# Patient Record
Sex: Female | Born: 1945 | Race: Black or African American | Hispanic: No | Marital: Married | Smoking: Never smoker
Health system: Southern US, Community
[De-identification: ages and names within clinical notes are randomized; demographics above are authoritative.]

## PROBLEM LIST (undated history)

## (undated) DIAGNOSIS — M1990 Unspecified osteoarthritis, unspecified site: Secondary | ICD-10-CM

## (undated) DIAGNOSIS — G510 Bell's palsy: Secondary | ICD-10-CM

## (undated) DIAGNOSIS — E119 Type 2 diabetes mellitus without complications: Secondary | ICD-10-CM

## (undated) DIAGNOSIS — I10 Essential (primary) hypertension: Secondary | ICD-10-CM

## (undated) DIAGNOSIS — Z860101 Personal history of adenomatous and serrated colon polyps: Secondary | ICD-10-CM

## (undated) HISTORY — PX: BUNIONECTOMY: SHX129

## (undated) HISTORY — PX: FINGER SURGERY: SHX640

## (undated) HISTORY — PX: BREAST BIOPSY: SHX20

---

## 2005-04-14 ENCOUNTER — Ambulatory Visit: Payer: Self-pay | Admitting: Internal Medicine

## 2006-07-20 ENCOUNTER — Ambulatory Visit: Payer: Self-pay | Admitting: Internal Medicine

## 2006-08-04 ENCOUNTER — Ambulatory Visit: Payer: Self-pay | Admitting: Internal Medicine

## 2007-04-02 ENCOUNTER — Ambulatory Visit: Payer: Self-pay | Admitting: Internal Medicine

## 2008-10-07 ENCOUNTER — Ambulatory Visit: Payer: Self-pay | Admitting: Internal Medicine

## 2009-01-19 ENCOUNTER — Ambulatory Visit: Payer: Self-pay | Admitting: Gastroenterology

## 2009-12-22 ENCOUNTER — Ambulatory Visit: Payer: Self-pay | Admitting: Internal Medicine

## 2011-05-04 ENCOUNTER — Ambulatory Visit: Payer: Self-pay | Admitting: Internal Medicine

## 2013-01-02 ENCOUNTER — Ambulatory Visit: Payer: Self-pay | Admitting: Internal Medicine

## 2013-04-30 ENCOUNTER — Ambulatory Visit: Payer: Self-pay | Admitting: Internal Medicine

## 2014-01-14 ENCOUNTER — Ambulatory Visit: Payer: Self-pay | Admitting: Internal Medicine

## 2014-03-14 DIAGNOSIS — Z860101 Personal history of adenomatous and serrated colon polyps: Secondary | ICD-10-CM | POA: Insufficient documentation

## 2014-03-25 ENCOUNTER — Ambulatory Visit: Payer: Self-pay | Admitting: Gastroenterology

## 2014-03-28 LAB — PATHOLOGY REPORT

## 2015-01-27 ENCOUNTER — Ambulatory Visit
Admit: 2015-01-27 | Disposition: A | Discharge status: Home / Self Care | Payer: Self-pay | Attending: Internal Medicine | Admitting: Internal Medicine

## 2016-07-12 ENCOUNTER — Encounter: Payer: Self-pay | Admitting: Emergency Medicine

## 2016-07-12 ENCOUNTER — Emergency Department
Admission: EM | Admit: 2016-07-12 | Discharge: 2016-07-12 | Disposition: A | Discharge status: Home / Self Care | Payer: Medicare Other | Attending: Emergency Medicine | Admitting: Emergency Medicine

## 2016-07-12 DIAGNOSIS — T7840XA Allergy, unspecified, initial encounter: Secondary | ICD-10-CM

## 2016-07-12 DIAGNOSIS — L500 Allergic urticaria: Secondary | ICD-10-CM | POA: Insufficient documentation

## 2016-07-12 DIAGNOSIS — L509 Urticaria, unspecified: Secondary | ICD-10-CM

## 2016-07-12 MED ORDER — PREDNISONE 20 MG PO TABS
60.0000 mg | ORAL_TABLET | Freq: Once | ORAL | Status: AC
  Administered 2016-07-12: 60 mg via ORAL
  Filled 2016-07-12: qty 3

## 2016-07-12 MED ORDER — DIPHENHYDRAMINE HCL 25 MG PO CAPS
25.0000 mg | ORAL_CAPSULE | Freq: Once | ORAL | Status: AC
  Administered 2016-07-12: 25 mg via ORAL
  Filled 2016-07-12: qty 1

## 2016-07-12 MED ORDER — FAMOTIDINE 20 MG PO TABS
20.0000 mg | ORAL_TABLET | Freq: Once | ORAL | Status: AC
  Administered 2016-07-12: 20 mg via ORAL
  Filled 2016-07-12: qty 1

## 2016-07-12 MED ORDER — FAMOTIDINE 20 MG PO TABS: 20.0000 mg | ORAL_TABLET | Freq: Two times a day (BID) | ORAL | 0 refills | Status: DC

## 2016-07-12 MED ORDER — PREDNISONE 20 MG PO TABS: ORAL_TABLET | ORAL | 0 refills | Status: DC

## 2016-07-12 MED ORDER — EPINEPHRINE 0.3 MG/0.3ML IJ SOAJ: 0.3000 mg | Freq: Once | INTRAMUSCULAR | 0 refills | Status: AC

## 2016-07-12 NOTE — Discharge Instructions (Signed)
1. Take the following medicines for the next 4 days: Prednisone 60mg daily Pepcid 20mg twice daily 2. Take Benadryl as needed for itching. 3. Use Epi-Pen in case of acute, life-threatening allergic reaction. 4. Return to the ER for worsening symptoms, persistent vomiting, difficulty breathing or other concerns.  

## 2016-07-12 NOTE — ED Triage Notes (Signed)
Patient ambulatory to triage with steady gait, without difficulty or distress noted; pt st ate shrimp 830pm, 87min PTA began breaking out in itchy rash; st applied cortisone cream with decreased symptoms now noted; denies hx of same; denies any accomp symptoms

## 2016-07-12 NOTE — ED Notes (Signed)
Pt presents to ED with c/o allergic reaction. Pt states had shrimp last night and began to have itching and whelps all over body. Pt reports did not change laundry detergent, body soap, or lotions, denies taking benadryl. Pt states used cortisone cream with relief of whelps but is still having itching. No whelps noted on body. Respirations even and unlabored, skin warm and dry.

## 2016-07-12 NOTE — ED Provider Notes (Signed)
[facility] Emergency Department Provider Note   ____________________________________________   First MD Initiated Contact with Patient 07/12/16 [phone]     (approximate)  I have reviewed the triage vital signs and the nursing notes.   HISTORY  Chief Complaint Allergic Reaction    HPI Cassandra Parks is a 70 y.o. female who presents to the ED from home with a chief complaint of allergic reaction and hives. Patient reports she ate shrimp approximately 8:30 PM. 30 minutes later she began to break out in itchy hives all over her body. Apply cortisone cream with near resolution of symptoms. Presents to the ED for evaluation. Denies associated facial or tongue swelling, throat tightness, shortness of breath, chest pain, abdominal pain, nausea, vomiting, diarrhea. States she has eaten the same dish previously without reaction. Denies other new exposures such as medicines, antibiotics, over the counters. Denies recent tick bite. Nothing made her symptoms worse. Cortisone cream improved her symptoms.   Past medical history None  There are no active problems to display for this patient.   No past surgical history on file.  Prior to Admission medications   Medication Sig Start Date End Date Taking? Authorizing Provider  EPINEPHrine 0.3 mg/0.3 mL IJ SOAJ injection Inject 0.3 mLs (0.3 mg total) into the muscle once. 07/12/16 07/12/16  Paulette Blanch, MD  famotidine (PEPCID) 20 MG tablet Take 1 tablet (20 mg total) by mouth 2 (two) times daily. 07/12/16   Paulette Blanch, MD  predniSONE (DELTASONE) 20 MG tablet 3 tablets daily x 4 days 07/12/16   Paulette Blanch, MD    Allergies Review of patient's allergies indicates no known allergies.  No family history on file.  Social History Social History  Substance Use Topics  . Smoking status: Never Smoker  . Smokeless tobacco: Never Used  . Alcohol use No    Review of Systems  Constitutional: No fever/chills. Eyes: No  visual changes. ENT: No sore throat. Cardiovascular: Denies chest pain. Respiratory: Denies shortness of breath. Gastrointestinal: No abdominal pain.  No nausea, no vomiting.  No diarrhea.  No constipation. Genitourinary: Negative for dysuria. Musculoskeletal: Negative for back pain. Skin: Positive for rash. Neurological: Negative for headaches, focal weakness or numbness.  10-point ROS otherwise negative.  ____________________________________________   PHYSICAL EXAM:  VITAL SIGNS: ED Triage Vitals  Enc Vitals Group     BP 07/12/16 0027 (!) 142/83     Pulse Rate 07/12/16 0027 79     Resp 07/12/16 0243 18     Temp 07/12/16 0027 98 F (36.7 C)     Temp Source 07/12/16 0027 Oral     SpO2 07/12/16 0027 98 %     Weight 07/12/16 0025 130 lb (59 kg)     Height 07/12/16 0025 5\' 1"  (1.549 m)     Head Circumference --      Peak Flow --      Pain Score --      Pain Loc --      Pain Edu? --      Excl. in [place]? --     Constitutional: Alert and oriented. Well appearing and in no acute distress. Eyes: Conjunctivae are normal. PERRL. EOMI. Head: Atraumatic. Nose: No congestion/rhinnorhea. Mouth/Throat: Mucous membranes are moist.  Oropharynx non-erythematous.  There is no hoarse or muffled voice. There is no drooling. Neck: No stridor.  Soft submental space. Cardiovascular: Normal rate, regular rhythm. Grossly normal heart sounds.  Good peripheral circulation. Respiratory: Normal respiratory effort.  No retractions. Lungs CTAB. No wheezing. Gastrointestinal: Soft and nontender. No distention. No abdominal bruits. No CVA tenderness. Musculoskeletal: No lower extremity tenderness nor edema.  No joint effusions. Neurologic:  Normal speech and language. No gross focal neurologic deficits are appreciated. No gait instability. Skin:  Skin is warm, dry and intact. Urticaria nearly completely resolved. There are 1-2 wheals on right upper arm.Marland Kitchen Psychiatric: Mood and affect are normal. Speech  and behavior are normal.  ____________________________________________   LABS (all labs ordered are listed, but only abnormal results are displayed)  Labs Reviewed - No data to display ____________________________________________  EKG  None ____________________________________________  RADIOLOGY  None ____________________________________________   PROCEDURES  Procedure(s) performed: None  Procedures  Critical Care performed: No  ____________________________________________   INITIAL IMPRESSION / ASSESSMENT AND PLAN / ED COURSE  Pertinent labs & imaging results that were available during my care of the patient were reviewed by me and considered in my medical decision making (see chart for details).  70 year old female who presents with urticaria shortly after eating shrimp. Will treat with Benadryl, prednisone, Pepcid and refer her for allergy testing. Strict return precautions given. Patient and family verbalized understanding and agree with plan of care.  Clinical Course     ____________________________________________   FINAL CLINICAL IMPRESSION(S) / ED DIAGNOSES  Final diagnoses:  Allergic reaction, initial encounter  Urticaria      NEW MEDICATIONS STARTED DURING THIS VISIT:  New Prescriptions   EPINEPHRINE 0.3 MG/0.3 ML IJ SOAJ INJECTION    Inject 0.3 mLs (0.3 mg total) into the muscle once.   FAMOTIDINE (PEPCID) 20 MG TABLET    Take 1 tablet (20 mg total) by mouth 2 (two) times daily.   PREDNISONE (DELTASONE) 20 MG TABLET    3 tablets daily x 4 days     Note:  This document was prepared using Dragon voice recognition software and may include unintentional dictation errors.    Paulette Blanch, MD 07/12/16 [phone]

## 2016-11-30 ENCOUNTER — Other Ambulatory Visit: Payer: Self-pay | Admitting: Internal Medicine

## 2016-11-30 DIAGNOSIS — Z1231 Encounter for screening mammogram for malignant neoplasm of breast: Secondary | ICD-10-CM

## 2017-01-30 ENCOUNTER — Encounter: Payer: Self-pay | Admitting: Radiology

## 2017-01-30 ENCOUNTER — Ambulatory Visit
Admission: RE | Admit: 2017-01-30 | Discharge: 2017-01-30 | Disposition: A | Discharge status: Home / Self Care | Payer: Medicare Other | Source: Ambulatory Visit | Attending: Internal Medicine | Admitting: Internal Medicine

## 2017-01-30 DIAGNOSIS — Z1231 Encounter for screening mammogram for malignant neoplasm of breast: Secondary | ICD-10-CM | POA: Diagnosis not present

## 2018-07-17 ENCOUNTER — Other Ambulatory Visit: Payer: Self-pay | Admitting: Internal Medicine

## 2018-07-17 DIAGNOSIS — Z1231 Encounter for screening mammogram for malignant neoplasm of breast: Secondary | ICD-10-CM

## 2018-08-09 ENCOUNTER — Ambulatory Visit
Admission: RE | Admit: 2018-08-09 | Discharge: 2018-08-09 | Disposition: A | Discharge status: Home / Self Care | Payer: Medicare Other | Source: Ambulatory Visit | Attending: Internal Medicine | Admitting: Internal Medicine

## 2018-08-09 DIAGNOSIS — Z1231 Encounter for screening mammogram for malignant neoplasm of breast: Secondary | ICD-10-CM | POA: Insufficient documentation

## 2019-04-19 ENCOUNTER — Other Ambulatory Visit (HOSPITAL_COMMUNITY): Payer: Self-pay | Admitting: Internal Medicine

## 2019-04-19 ENCOUNTER — Other Ambulatory Visit: Payer: Self-pay | Admitting: Internal Medicine

## 2019-04-19 DIAGNOSIS — R0989 Other specified symptoms and signs involving the circulatory and respiratory systems: Secondary | ICD-10-CM

## 2019-04-25 ENCOUNTER — Ambulatory Visit
Admission: RE | Admit: 2019-04-25 | Discharge: 2019-04-25 | Disposition: A | Discharge status: Home / Self Care | Payer: Medicare Other | Source: Ambulatory Visit | Attending: Internal Medicine | Admitting: Internal Medicine

## 2019-04-25 ENCOUNTER — Encounter (INDEPENDENT_AMBULATORY_CARE_PROVIDER_SITE_OTHER): Payer: Self-pay

## 2019-04-25 ENCOUNTER — Other Ambulatory Visit: Payer: Self-pay

## 2019-04-25 DIAGNOSIS — R0989 Other specified symptoms and signs involving the circulatory and respiratory systems: Secondary | ICD-10-CM | POA: Diagnosis present

## 2019-07-31 ENCOUNTER — Other Ambulatory Visit: Payer: Self-pay | Admitting: Internal Medicine

## 2019-07-31 DIAGNOSIS — Z1231 Encounter for screening mammogram for malignant neoplasm of breast: Secondary | ICD-10-CM

## 2019-08-22 ENCOUNTER — Inpatient Hospital Stay: Admission: RE | Admit: 2019-08-22 | Payer: Medicare Other | Source: Ambulatory Visit

## 2019-12-25 ENCOUNTER — Ambulatory Visit
Admission: RE | Admit: 2019-12-25 | Discharge: 2019-12-25 | Disposition: A | Discharge status: Home / Self Care | Payer: Medicare Other | Source: Ambulatory Visit | Attending: Internal Medicine | Admitting: Internal Medicine

## 2019-12-25 ENCOUNTER — Other Ambulatory Visit: Payer: Self-pay

## 2019-12-25 DIAGNOSIS — Z1231 Encounter for screening mammogram for malignant neoplasm of breast: Secondary | ICD-10-CM | POA: Diagnosis present

## 2019-12-30 ENCOUNTER — Other Ambulatory Visit: Payer: Self-pay | Admitting: Internal Medicine

## 2019-12-30 DIAGNOSIS — R928 Other abnormal and inconclusive findings on diagnostic imaging of breast: Secondary | ICD-10-CM

## 2019-12-30 DIAGNOSIS — N6310 Unspecified lump in the right breast, unspecified quadrant: Secondary | ICD-10-CM

## 2020-01-10 ENCOUNTER — Ambulatory Visit
Admission: RE | Admit: 2020-01-10 | Discharge: 2020-01-10 | Disposition: A | Discharge status: Home / Self Care | Payer: Medicare Other | Source: Ambulatory Visit | Attending: Internal Medicine | Admitting: Internal Medicine

## 2020-01-10 DIAGNOSIS — R928 Other abnormal and inconclusive findings on diagnostic imaging of breast: Secondary | ICD-10-CM | POA: Insufficient documentation

## 2020-01-10 DIAGNOSIS — N6310 Unspecified lump in the right breast, unspecified quadrant: Secondary | ICD-10-CM | POA: Diagnosis present

## 2020-02-05 ENCOUNTER — Other Ambulatory Visit: Payer: Self-pay | Admitting: Internal Medicine

## 2020-02-05 DIAGNOSIS — R7989 Other specified abnormal findings of blood chemistry: Secondary | ICD-10-CM

## 2020-02-11 ENCOUNTER — Ambulatory Visit
Admission: RE | Admit: 2020-02-11 | Discharge: 2020-02-11 | Disposition: A | Discharge status: Home / Self Care | Payer: Medicare Other | Source: Ambulatory Visit | Attending: Internal Medicine | Admitting: Internal Medicine

## 2020-02-11 ENCOUNTER — Other Ambulatory Visit: Payer: Self-pay

## 2020-02-11 ENCOUNTER — Encounter (INDEPENDENT_AMBULATORY_CARE_PROVIDER_SITE_OTHER): Payer: Self-pay

## 2020-02-11 DIAGNOSIS — R7989 Other specified abnormal findings of blood chemistry: Secondary | ICD-10-CM | POA: Diagnosis not present

## 2020-02-11 DIAGNOSIS — N184 Chronic kidney disease, stage 4 (severe): Secondary | ICD-10-CM | POA: Insufficient documentation

## 2020-02-11 DIAGNOSIS — E1122 Type 2 diabetes mellitus with diabetic chronic kidney disease: Secondary | ICD-10-CM | POA: Insufficient documentation

## 2020-02-14 ENCOUNTER — Ambulatory Visit: Payer: Medicare Other

## 2020-05-19 ENCOUNTER — Other Ambulatory Visit: Payer: Self-pay | Admitting: Gastroenterology

## 2020-05-19 DIAGNOSIS — R1903 Right lower quadrant abdominal swelling, mass and lump: Secondary | ICD-10-CM

## 2020-05-19 DIAGNOSIS — R634 Abnormal weight loss: Secondary | ICD-10-CM

## 2020-05-27 ENCOUNTER — Ambulatory Visit: Admission: RE | Admit: 2020-05-27 | Payer: Medicare Other | Source: Ambulatory Visit

## 2020-05-28 ENCOUNTER — Other Ambulatory Visit: Payer: Self-pay

## 2020-05-28 ENCOUNTER — Other Ambulatory Visit
Admission: RE | Admit: 2020-05-28 | Discharge: 2020-05-28 | Disposition: A | Discharge status: Home / Self Care | Payer: Medicare Other | Source: Ambulatory Visit | Attending: Gastroenterology | Admitting: Gastroenterology

## 2020-05-28 DIAGNOSIS — Z20822 Contact with and (suspected) exposure to covid-19: Secondary | ICD-10-CM | POA: Diagnosis not present

## 2020-05-28 DIAGNOSIS — Z01812 Encounter for preprocedural laboratory examination: Secondary | ICD-10-CM | POA: Diagnosis present

## 2020-05-28 LAB — SARS CORONAVIRUS 2 (TAT 6-24 HRS): SARS Coronavirus 2: NEGATIVE

## 2020-05-29 ENCOUNTER — Encounter: Payer: Self-pay | Admitting: *Deleted

## 2020-06-01 ENCOUNTER — Ambulatory Visit: Payer: Medicare Other | Admitting: Anesthesiology

## 2020-06-01 ENCOUNTER — Other Ambulatory Visit: Payer: Self-pay

## 2020-06-01 ENCOUNTER — Encounter: Payer: Self-pay | Admitting: *Deleted

## 2020-06-01 ENCOUNTER — Ambulatory Visit
Admission: RE | Admit: 2020-06-01 | Discharge: 2020-06-01 | Disposition: A | Discharge status: Home / Self Care | Payer: Medicare Other | Attending: Gastroenterology | Admitting: Gastroenterology

## 2020-06-01 ENCOUNTER — Encounter
Admission: RE | Disposition: A | Discharge status: Home / Self Care | Payer: Self-pay | Source: Home / Self Care | Attending: Gastroenterology

## 2020-06-01 DIAGNOSIS — Z800 Family history of malignant neoplasm of digestive organs: Secondary | ICD-10-CM | POA: Diagnosis not present

## 2020-06-01 DIAGNOSIS — K640 First degree hemorrhoids: Secondary | ICD-10-CM | POA: Diagnosis not present

## 2020-06-01 DIAGNOSIS — G709 Myoneural disorder, unspecified: Secondary | ICD-10-CM | POA: Insufficient documentation

## 2020-06-01 DIAGNOSIS — E119 Type 2 diabetes mellitus without complications: Secondary | ICD-10-CM | POA: Insufficient documentation

## 2020-06-01 DIAGNOSIS — Z79899 Other long term (current) drug therapy: Secondary | ICD-10-CM | POA: Diagnosis not present

## 2020-06-01 DIAGNOSIS — R634 Abnormal weight loss: Secondary | ICD-10-CM | POA: Diagnosis not present

## 2020-06-01 DIAGNOSIS — G510 Bell's palsy: Secondary | ICD-10-CM | POA: Insufficient documentation

## 2020-06-01 DIAGNOSIS — Z6821 Body mass index (BMI) 21.0-21.9, adult: Secondary | ICD-10-CM | POA: Insufficient documentation

## 2020-06-01 DIAGNOSIS — D124 Benign neoplasm of descending colon: Secondary | ICD-10-CM | POA: Diagnosis not present

## 2020-06-01 DIAGNOSIS — Z7952 Long term (current) use of systemic steroids: Secondary | ICD-10-CM | POA: Diagnosis not present

## 2020-06-01 DIAGNOSIS — I10 Essential (primary) hypertension: Secondary | ICD-10-CM | POA: Insufficient documentation

## 2020-06-01 DIAGNOSIS — K3189 Other diseases of stomach and duodenum: Secondary | ICD-10-CM | POA: Insufficient documentation

## 2020-06-01 DIAGNOSIS — D123 Benign neoplasm of transverse colon: Secondary | ICD-10-CM | POA: Diagnosis not present

## 2020-06-01 DIAGNOSIS — Z1211 Encounter for screening for malignant neoplasm of colon: Secondary | ICD-10-CM | POA: Diagnosis present

## 2020-06-01 HISTORY — DX: Bell's palsy: G51.0

## 2020-06-01 HISTORY — DX: Essential (primary) hypertension: I10

## 2020-06-01 HISTORY — DX: Type 2 diabetes mellitus without complications: E11.9

## 2020-06-01 HISTORY — PX: COLONOSCOPY WITH PROPOFOL: SHX5780

## 2020-06-01 HISTORY — PX: ESOPHAGOGASTRODUODENOSCOPY (EGD) WITH PROPOFOL: SHX5813

## 2020-06-01 LAB — GLUCOSE, CAPILLARY: Glucose-Capillary: 116 mg/dL — ABNORMAL HIGH (ref 70–99)

## 2020-06-01 SURGERY — COLONOSCOPY WITH PROPOFOL
Anesthesia: General

## 2020-06-01 MED ORDER — SODIUM CHLORIDE 0.9 % IV SOLN: INTRAVENOUS | Status: DC

## 2020-06-01 MED ORDER — PROPOFOL 500 MG/50ML IV EMUL
INTRAVENOUS | Status: DC | PRN
  Administered 2020-06-01: 150 ug/kg/min via INTRAVENOUS

## 2020-06-01 MED ORDER — PROPOFOL 10 MG/ML IV BOLUS
INTRAVENOUS | Status: AC
  Filled 2020-06-01: qty 20

## 2020-06-01 MED ORDER — LIDOCAINE HCL (PF) 2 % IJ SOLN
INTRAMUSCULAR | Status: AC
  Filled 2020-06-01: qty 5

## 2020-06-01 MED ORDER — PROPOFOL 10 MG/ML IV BOLUS
INTRAVENOUS | Status: DC | PRN
  Administered 2020-06-01: 100 mg via INTRAVENOUS

## 2020-06-01 MED ORDER — LIDOCAINE HCL (CARDIAC) PF 100 MG/5ML IV SOSY
PREFILLED_SYRINGE | INTRAVENOUS | Status: DC | PRN
  Administered 2020-06-01: 100 mg via INTRAVENOUS

## 2020-06-01 MED ORDER — PROPOFOL 500 MG/50ML IV EMUL
INTRAVENOUS | Status: AC
  Filled 2020-06-01: qty 50

## 2020-06-01 NOTE — Transfer of Care (Signed)
Immediate Anesthesia Transfer of Care Note  Patient: Cassandra Parks  Procedure(s) Performed: COLONOSCOPY WITH PROPOFOL (N/A ) ESOPHAGOGASTRODUODENOSCOPY (EGD) WITH PROPOFOL (N/A )  Patient Location: PACU and Endoscopy Unit  Anesthesia Type:General  Level of Consciousness: awake and drowsy  Airway & Oxygen Therapy: Patient Spontanous Breathing  Post-op Assessment: Report given to RN and Post -op Vital signs reviewed and stable  Post vital signs: Reviewed and stable  Last Vitals:  Vitals Value Taken Time  BP 102/50 06/01/20 1131  Temp 36.2 C 06/01/20 1131  Pulse 67 06/01/20 1132  Resp 14 06/01/20 1132  SpO2 100 % 06/01/20 1132    Last Pain:  Vitals:   06/01/20 1131  TempSrc: Tympanic  PainSc: Asleep         Complications: No complications documented.

## 2020-06-01 NOTE — Op Note (Signed)
[facility] Gastroenterology Patient Name: Cassandra Parks Procedure Date: 06/01/2020 10:50 AM MRN: [id number] Account #: [id number] Date of Birth: [birth year] Admit Type: Outpatient Age: 74 Room: [facility] ENDO ROOM 2 Gender: Female Note Status: Finalized Procedure:             Upper GI endoscopy Indications:           Weight loss Providers:             Andrey Farmer MD, MD Referring MD:          Leona Carry. Hall Busing, MD (Referring MD) Medicines:             Monitored Anesthesia Care Complications:         No immediate complications. Estimated blood loss:                         Minimal. Procedure:             Pre-Anesthesia Assessment:                        - Prior to the procedure, a History and Physical was                         performed, and patient medications and allergies were                         reviewed. The patient is competent. The risks and                         benefits of the procedure and the sedation options and                         risks were discussed with the patient. All questions                         were answered and informed consent was obtained.                         Patient identification and proposed procedure were                         verified by the physician, the nurse, the anesthetist                         and the technician in the endoscopy suite. Mental                         Status Examination: alert and oriented. Airway                         Examination: normal oropharyngeal airway and neck                         mobility. Respiratory Examination: clear to                         auscultation. CV Examination: normal. Prophylactic  Antibiotics: The patient does not require prophylactic                         antibiotics. Prior Anticoagulants: The patient has                         taken no previous anticoagulant or antiplatelet                         agents. ASA Grade Assessment: II - A  patient with mild                         systemic disease. After reviewing the risks and                         benefits, the patient was deemed in satisfactory                         condition to undergo the procedure. The anesthesia                         plan was to use monitored anesthesia care (MAC).                         Immediately prior to administration of medications,                         the patient was re-assessed for adequacy to receive                         sedatives. The heart rate, respiratory rate, oxygen                         saturations, blood pressure, adequacy of pulmonary                         ventilation, and response to care were monitored                         throughout the procedure. The physical status of the                         patient was re-assessed after the procedure.                        After obtaining informed consent, the endoscope was                         passed under direct vision. Throughout the procedure,                         the patient's blood pressure, pulse, and oxygen                         saturations were monitored continuously. The Endoscope                         was introduced through the mouth, and advanced to the  second part of duodenum. The upper GI endoscopy was                         accomplished without difficulty. The patient tolerated                         the procedure well. Findings:      The examined esophagus was normal.      A single localized 2 mm erosion with no stigmata of recent bleeding was       found in the gastric antrum. Biopsies were taken with a cold forceps for       histology. Estimated blood loss was minimal.      The exam of the stomach was otherwise normal.      The examined duodenum was normal. Impression:            - Normal esophagus.                        - Erosive gastropathy with no stigmata of recent                         bleeding.  Biopsied.                        - Normal examined duodenum. Recommendation:        - Discharge patient to home.                        - Resume previous diet.                        - Continue present medications.                        - Await pathology results.                        - Return to referring physician as previously                         scheduled. Procedure Code(s):     --- Professional ---                        [phone], Esophagogastroduodenoscopy, flexible,                         transoral; with biopsy, single or multiple Diagnosis Code(s):     --- Professional ---                        K31.89, Other diseases of stomach and duodenum                        R63.4, Abnormal weight loss CPT copyright 2019 American Medical Association. All rights reserved. The codes documented in this report are preliminary and upon coder review may  be revised to meet current compliance requirements. Andrey Farmer, MD Andrey Farmer MD, MD 06/01/2020 11:30:10 AM Number of Addenda: 0 Note Initiated On: 06/01/2020 10:50 AM Estimated Blood Loss:  Estimated blood loss was minimal.      [facility]

## 2020-06-01 NOTE — H&P (Signed)
Outpatient short stay form Pre-procedure 06/01/2020 10:20 AM Raylene Miyamoto MD, MPH  Primary Physician: Dr. Hall Busing  Reason for visit:  Weight Loss  History of present illness:   74 y/o lady with family history of colon cancer in her daughter who recently passed away along with her mother who recently passed away. No blood thinners.    Current Facility-Administered Medications:  .  0.9 %  sodium chloride infusion, , Intravenous, Continuous, Dehaven Sine, Hilton Cork, MD  Medications Prior to Admission  Medication Sig Dispense Refill Last Dose  . losartan (COZAAR) 25 MG tablet Take 25 mg by mouth daily.   05/31/2020 at Unknown time  . Na Sulfate-K Sulfate-Mg Sulf 17.5-3.13-1.6 GM/177ML SOLN Take 1 Bottle by mouth.     . triamterene-hydrochlorothiazide (MAXZIDE-25) 37.5-25 MG tablet Take 1 tablet by mouth daily.   05/31/2020 at Unknown time  . famotidine (PEPCID) 20 MG tablet Take 1 tablet (20 mg total) by mouth 2 (two) times daily. (Patient not taking: Reported on 06/01/2020) 8 tablet 0 Not Taking at Unknown time  . predniSONE (DELTASONE) 20 MG tablet 3 tablets daily x 4 days (Patient not taking: Reported on 06/01/2020) 12 tablet 0 Not Taking at Unknown time     No Known Allergies   Past Medical History:  Diagnosis Date  . Diabetes mellitus without complication ([place])   . Hypertension   . Palsy, Bell's     Review of systems:  Otherwise negative.    Physical Exam  Gen: Alert, oriented. Appears stated age.  HEENT: [place]/AT. PERRLA. Lungs: No respiratory distress Abd: soft, benign, no masses.  Ext: No edema.    Planned procedures: Proceed with EGD/colonoscopy. The patient understands the nature of the planned procedure, indications, risks, alternatives and potential complications including but not limited to bleeding, infection, perforation, damage to internal organs and possible oversedation/side effects from anesthesia. The patient agrees and gives consent to proceed.  Please refer to  procedure notes for findings, recommendations and patient disposition/instructions.     Raylene Miyamoto MD, MPH. Gastroenterology 06/01/2020  10:20 AM

## 2020-06-01 NOTE — Anesthesia Postprocedure Evaluation (Signed)
Anesthesia Post Note  Patient: SKARLET LYONS  Procedure(s) Performed: COLONOSCOPY WITH PROPOFOL (N/A ) ESOPHAGOGASTRODUODENOSCOPY (EGD) WITH PROPOFOL (N/A )  Patient location during evaluation: Endoscopy Anesthesia Type: General Level of consciousness: awake and alert Pain management: pain level controlled Vital Signs Assessment: post-procedure vital signs reviewed and stable Respiratory status: spontaneous breathing, nonlabored ventilation, respiratory function stable and patient connected to nasal cannula oxygen Cardiovascular status: blood pressure returned to baseline and stable Postop Assessment: no apparent nausea or vomiting Anesthetic complications: no   No complications documented.   Last Vitals:  Vitals:   06/01/20 1151 06/01/20 1201  BP: 115/63 125/73  Pulse: (!) 59 66  Resp: 12 16  Temp:    SpO2: 98% 100%    Last Pain:  Vitals:   06/01/20 1201  TempSrc:   PainSc: 0-No pain                 Precious Haws Demosthenes Virnig

## 2020-06-01 NOTE — Op Note (Addendum)
[facility] Gastroenterology Patient Name: Montina Dorrance Procedure Date: 06/01/2020 10:49 AM MRN: [id number] Account #: [id number] Date of Birth: 1946/06/23 Admit Type: Outpatient Age: 74 Room: [facility] ENDO ROOM 2 Gender: Female Note Status: Supervisor Override Procedure:             Colonoscopy Indications:           Family history of colon cancer in a first-degree                         relative before age 32 years, Personal history of                         colonic polyps, Weight loss Providers:             Andrey Farmer MD, MD Referring MD:          Leona Carry. Hall Busing, MD (Referring MD) Medicines:             Monitored Anesthesia Care Complications:         No immediate complications. Estimated blood loss:                         Minimal. Procedure:             Pre-Anesthesia Assessment:                        - Prior to the procedure, a History and Physical was                         performed, and patient medications and allergies were                         reviewed. The patient is competent. The risks and                         benefits of the procedure and the sedation options and                         risks were discussed with the patient. All questions                         were answered and informed consent was obtained.                         Patient identification and proposed procedure were                         verified by the physician, the nurse, the anesthetist                         and the technician in the endoscopy suite. Mental                         Status Examination: alert and oriented. Airway                         Examination: normal oropharyngeal airway and neck  mobility. Respiratory Examination: clear to                         auscultation. CV Examination: normal. Prophylactic                         Antibiotics: The patient does not require prophylactic                         antibiotics. Prior  Anticoagulants: The patient has                         taken no previous anticoagulant or antiplatelet                         agents. ASA Grade Assessment: II - A patient with mild                         systemic disease. After reviewing the risks and                         benefits, the patient was deemed in satisfactory                         condition to undergo the procedure. The anesthesia                         plan was to use monitored anesthesia care (MAC).                         Immediately prior to administration of medications,                         the patient was re-assessed for adequacy to receive                         sedatives. The heart rate, respiratory rate, oxygen                         saturations, blood pressure, adequacy of pulmonary                         ventilation, and response to care were monitored                         throughout the procedure. The physical status of the                         patient was re-assessed after the procedure.                        After obtaining informed consent, the colonoscope was                         passed under direct vision. Throughout the procedure,                         the patient's blood pressure, pulse, and oxygen  saturations were monitored continuously. The                         Colonoscope was introduced through the anus and                         advanced to the the terminal ileum. The colonoscopy                         was performed without difficulty. The patient                         tolerated the procedure well. The quality of the bowel                         preparation was good. Findings:      The perianal and digital rectal examinations were normal.      The terminal ileum appeared normal.      A 6 mm polyp was found in the transverse colon. The polyp was sessile.       The polyp was removed with a cold snare. Resection and retrieval were       complete.  Estimated blood loss was minimal.      A 5 mm polyp was found in the descending colon. The polyp was sessile.       The polyp was removed with a cold snare. Resection and retrieval were       complete. Estimated blood loss was minimal.      A 2 mm polyp was found in the descending colon. The polyp was sessile.       The polyp was removed with a jumbo cold forceps. Resection and retrieval       were complete. Estimated blood loss was minimal.      Non-bleeding internal hemorrhoids were found during retroflexion. The       hemorrhoids were Grade I (internal hemorrhoids that do not prolapse).      The exam was otherwise without abnormality on direct and retroflexion       views. Impression:            - The examined portion of the ileum was normal.                        - One 6 mm polyp in the transverse colon, removed with                         a cold snare. Resected and retrieved.                        - One 5 mm polyp in the descending colon, removed with                         a cold snare. Resected and retrieved.                        - One 2 mm polyp in the descending colon, removed with                         a jumbo cold forceps. Resected and retrieved.                        -  Non-bleeding internal hemorrhoids.                        - The examination was otherwise normal on direct and                         retroflexion views. Recommendation:        - Discharge patient to home.                        - Resume previous diet.                        - Continue present medications.                        - Await pathology results.                        - Repeat colonoscopy in 3 years for surveillance.                        - Return to referring physician as previously                         scheduled. Procedure Code(s):     --- Professional ---                        [phone], Colonoscopy, flexible; with removal of                         tumor(s), polyp(s), or other  lesion(s) by snare                         technique                        [id number], 59, Colonoscopy, flexible; with biopsy, single                         or multiple Diagnosis Code(s):     --- Professional ---                        K64.0, First degree hemorrhoids                        K63.5, Polyp of colon                        Z80.0, Family history of malignant neoplasm of                         digestive organs                        R63.4, Abnormal weight loss CPT copyright 2019 American Medical Association. All rights reserved. The codes documented in this report are preliminary and upon coder review may  be revised to meet current compliance requirements. Andrey Farmer, MD Andrey Farmer MD, MD 06/01/2020 11:34:00 AM Number of Addenda: 0 Note Initiated On: 06/01/2020 10:49 AM Scope Withdrawal Time: 0 hours 12 minutes 59 seconds  Total Procedure  Duration: 0 hours 22 minutes 5 seconds  Estimated Blood Loss:  Estimated blood loss was minimal.      [facility]

## 2020-06-01 NOTE — Interval H&P Note (Signed)
History and Physical Interval Note:  06/01/2020 10:24 AM  Cassandra Parks  has presented today for surgery, with the diagnosis of HX ADEN POLYP WEIGHT LOSS.  The various methods of treatment have been discussed with the patient and family. After consideration of risks, benefits and other options for treatment, the patient has consented to  Procedure(s): COLONOSCOPY WITH PROPOFOL (N/A) ESOPHAGOGASTRODUODENOSCOPY (EGD) WITH PROPOFOL (N/A) as a surgical intervention.  The patient's history has been reviewed, patient examined, no change in status, stable for surgery.  I have reviewed the patient's chart and labs.  Questions were answered to the patient's satisfaction.     Lesly Rubenstein  Ok to proceed with EGD/Colonoscopy

## 2020-06-01 NOTE — Anesthesia Preprocedure Evaluation (Signed)
Anesthesia Evaluation  Patient identified by MRN, date of birth, ID band Patient awake    Reviewed: Allergy & Precautions, H&P , NPO status , Patient's Chart, lab work & pertinent test results  History of Anesthesia Complications Negative for: history of anesthetic complications  Airway Mallampati: III  TM Distance: >3 FB Neck ROM: limited    Dental  (+) Chipped   Pulmonary neg pulmonary ROS, neg shortness of breath,    Pulmonary exam normal        Cardiovascular Exercise Tolerance: Good hypertension, (-) angina(-) Past MI and (-) DOE Normal cardiovascular exam     Neuro/Psych  Neuromuscular disease negative psych ROS   GI/Hepatic negative GI ROS, Neg liver ROS, neg GERD  ,  Endo/Other  diabetes, Type 2  Renal/GU negative Renal ROS  negative genitourinary   Musculoskeletal   Abdominal   Peds  Hematology negative hematology ROS (+)   Anesthesia Other Findings Past Medical History: No date: Diabetes mellitus without complication (HCC) No date: Hypertension No date: Palsy, Bell's  Past Surgical History: No date: BREAST BIOPSY; Left     Comment:  negative No date: varicose veins  BMI    Body Mass Index: 21.73 kg/m      Reproductive/Obstetrics negative OB ROS                             Anesthesia Physical Anesthesia Plan  ASA: II  Anesthesia Plan: General   Post-op Pain Management:    Induction: Intravenous  PONV Risk Score and Plan: Propofol infusion and TIVA  Airway Management Planned: Natural Airway and Nasal Cannula  Additional Equipment:   Intra-op Plan:   Post-operative Plan:   Informed Consent: I have reviewed the patients History and Physical, chart, labs and discussed the procedure including the risks, benefits and alternatives for the proposed anesthesia with the patient or authorized representative who has indicated his/her understanding and acceptance.      Dental Advisory Given  Plan Discussed with: Anesthesiologist, CRNA and Surgeon  Anesthesia Plan Comments: (Patient consented for risks of anesthesia including but not limited to:  - adverse reactions to medications - risk of intubation if required - damage to eyes, teeth, lips or other oral mucosa - nerve damage due to positioning  - sore throat or hoarseness - Damage to heart, brain, nerves, lungs, other parts of body or loss of life  Patient voiced understanding.)        Anesthesia Quick Evaluation

## 2020-06-02 ENCOUNTER — Encounter: Payer: Self-pay | Admitting: Gastroenterology

## 2020-06-02 LAB — SURGICAL PATHOLOGY

## 2020-06-05 ENCOUNTER — Ambulatory Visit
Admission: RE | Admit: 2020-06-05 | Discharge: 2020-06-05 | Disposition: A | Discharge status: Home / Self Care | Payer: Medicare Other | Source: Ambulatory Visit | Attending: Gastroenterology | Admitting: Gastroenterology

## 2020-06-05 ENCOUNTER — Other Ambulatory Visit: Payer: Self-pay

## 2020-06-05 DIAGNOSIS — R1903 Right lower quadrant abdominal swelling, mass and lump: Secondary | ICD-10-CM

## 2020-06-05 DIAGNOSIS — R634 Abnormal weight loss: Secondary | ICD-10-CM | POA: Diagnosis present

## 2020-06-05 MED ORDER — IOHEXOL 300 MG/ML  SOLN
75.0000 mL | Freq: Once | INTRAMUSCULAR | Status: AC | PRN
  Administered 2020-06-05: 75 mL via INTRAVENOUS

## 2020-07-09 ENCOUNTER — Other Ambulatory Visit: Payer: Self-pay | Admitting: General Surgery

## 2020-07-09 NOTE — Progress Notes (Signed)
Subjective:     Patient ID: Cassandra Parks is a 74 y.o. female.  HPI  The following portions of the patient's history were reviewed and updated as appropriate.  This a new patient is here today for: office visit. Here for evaluate inguinal hernia referred by Dr Haig Prophet. She states she has had a right ingiuinal knot for about 2-3 months with some discomfort. She states the knot will come in and out at random times. Bowels move daily.   The patient has experienced significant weight loss this year.  She buried a daughter from colon cancer in spring and then her 81 year old mother this past July.  During this time she reports a significant loss in appetite.  Still managing her own home.  Review of Systems  Constitutional: Negative for chills and fever.  Respiratory: Negative for cough.         Chief Complaint  Patient presents with  . New Patient    evaluate inguinal hernia     BP 125/73   Pulse 83   Temp 36.2 C (97.2 F)   Ht 154.9 cm (5\' 1" )   Wt 51.3 kg (113 lb)   SpO2 99%   BMI 21.35 kg/m       Past Medical History:  Diagnosis Date  . Bell's palsy   . Colon polyp 03/25/14   hyperplastic, tubular adenoma  . Diabetes mellitus type 2, uncomplicated (CMS-HCC)   . Hypertension           Past Surgical History:  Procedure Laterality Date  . COLONOSCOPY  03/25/14   repeat 5 years per [facility]  . COLONOSCOPY  06/01/2020   (4) Tubular adenoma/Repeat 51yrs/[facility]  . EGD  06/01/2020   Normal EGD biopsy/No Repeat/[facility]  . varicose vein removal                OB History    Gravida  3   Para  3   Term      Preterm      AB      Living        SAB      TAB      Ectopic      Molar      Multiple      Live Births          Obstetric Comments  Age at first period 25 Age of first pregnancy 89         Social History          Socioeconomic History  . Marital status: Married    Spouse name: Not on file  .  Number of children: Not on file  . Years of education: Not on file  . Highest education level: Not on file  Occupational History  . Not on file  Tobacco Use  . Smoking status: Never Smoker  . Smokeless tobacco: Never Used  Substance and Sexual Activity  . Alcohol use: No    Alcohol/week: 0.0 standard drinks  . Drug use: No  . Sexual activity: Defer  Other Topics Concern  . Not on file  Social History Narrative  . Not on file   Social Determinants of Health      Financial Resource Strain:   . Difficulty of Paying Living Expenses:   Food Insecurity:   . Worried About Charity fundraiser in the Last Year:   . Arboriculturist in the Last Year:   Transportation Needs:   . Film/video editor (Medical):   Marland Kitchen  Lack of Transportation (Non-Medical):        No Known Allergies  Current Medications        Current Outpatient Medications  Medication Sig Dispense Refill  . losartan (COZAAR) 25 MG tablet Take 25 mg by mouth once daily.  5  . triamterene-hydrochlorothiazide (MAXZIDE-25) 37.5-25 mg tablet Take 1 tablet by mouth once daily        No current facility-administered medications for this visit.           Family History  Problem Relation Age of Onset  . Colon cancer Father        ? age  . Diabetes Mother   . High blood pressure (Hypertension) Mother   . Breast cancer Paternal Aunt   . Colon cancer Daughter 101       Objective:   Physical Exam Exam conducted with a chaperone present.  Constitutional:      Appearance: Normal appearance.  Cardiovascular:     Rate and Rhythm: Normal rate and regular rhythm.     Pulses: Normal pulses.          Femoral pulses are 2+ on the right side and 2+ on the left side.    Heart sounds: Normal heart sounds.  Pulmonary:     Effort: Pulmonary effort is normal.     Breath sounds: Normal breath sounds.  Abdominal:     Palpations: Abdomen is soft.     Hernia: A hernia is present. Hernia is present in the  right inguinal area.    Musculoskeletal:     Cervical back: Neck supple.  Lymphadenopathy:     Lower Body: No right inguinal adenopathy. No left inguinal adenopathy.  Skin:    General: Skin is warm and dry.  Neurological:     Mental Status: She is alert and oriented to person, place, and time.  Psychiatric:        Mood and Affect: Mood normal.        Behavior: Behavior normal.    Labs and Radiology:   CT of the abdomen pelvis dated June 05, 2020 was independently reviewed:  IMPRESSION: 1. There are small, right greater than left fat containing inguinal hernias. 2. Aortic Atherosclerosis (ICD10-I70.0).  On my review, the left side is unimpressive.  Clinically asymptomatic.     Assessment:     Symptomatic right inguinal hernia.  No clinical evidence of left inguinal hernia.    Plan:     Options for management reviewed: 1) observation versus 2) elective repair.  Considering the symptoms the patient's experience and the need to manually reduce the hernia, with discomfort during reduction, operative repair seems prudent.  Pros and cons of prosthetic mesh reinforcement were discussed.  I think she would be well served based on her active lifestyle in good health.  Surgery is tentatively scheduled for July 24, 2020.     Entered by Karie Fetch, RN, acting as a scribe for Dr. Hervey Ard, MD.  The documentation recorded by the scribe accurately reflects the service I personally performed and the decisions made by me.   Robert Bellow, MD FACS

## 2020-07-16 ENCOUNTER — Other Ambulatory Visit: Payer: Self-pay

## 2020-07-16 ENCOUNTER — Encounter
Admission: RE | Admit: 2020-07-16 | Discharge: 2020-07-16 | Disposition: A | Discharge status: Home / Self Care | Payer: Medicare Other | Source: Ambulatory Visit | Attending: General Surgery | Admitting: General Surgery

## 2020-07-16 DIAGNOSIS — Z01818 Encounter for other preprocedural examination: Secondary | ICD-10-CM | POA: Insufficient documentation

## 2020-07-16 DIAGNOSIS — I10 Essential (primary) hypertension: Secondary | ICD-10-CM | POA: Insufficient documentation

## 2020-07-16 DIAGNOSIS — E118 Type 2 diabetes mellitus with unspecified complications: Secondary | ICD-10-CM | POA: Insufficient documentation

## 2020-07-16 HISTORY — DX: Unspecified osteoarthritis, unspecified site: M19.90

## 2020-07-16 NOTE — Patient Instructions (Signed)
Your procedure is scheduled on:07-24-20 FRIDAY Report to Day Surgery on the 2nd floor of the [facility]. To find out your arrival time, please call [phone] between 1PM - 3PM on:07-23-20 THURSDAY  REMEMBER: Instructions that are not followed completely may result in serious medical risk, up to and including death; or upon the discretion of your surgeon and anesthesiologist your surgery may need to be rescheduled.  Do not eat food after midnight the night before surgery.  No gum chewing, lozengers or hard candies.  You may however, drink WATER up to 2 hours before you are scheduled to arrive for your surgery. Do not drink anything within 2 hours of your scheduled arrival time.  Type 1 and Type 2 diabetics should only drink water.  TAKE THESE MEDICATIONS THE MORNING OF SURGERY WITH A SIP OF WATER: -NONE  One week prior to surgery: Stop Anti-inflammatories (NSAIDS) such as Advil, Aleve, Ibuprofen, Motrin, Naproxen, Naprosyn and Aspirin based products such as Excedrin, Goodys Powder, BC Powder-OK TO TAKE TYLENOL IF NEEDED  Stop ANY OVER THE COUNTER supplements until after surgery  No Alcohol for 24 hours before or after surgery.  No Smoking including e-cigarettes for 24 hours prior to surgery.  No chewable tobacco products for at least 6 hours prior to surgery.  No nicotine patches on the day of surgery.  Do not use any "recreational" drugs for at least a week prior to your surgery.  Please be advised that the combination of cocaine and anesthesia may have negative outcomes, up to and including death. If you test positive for cocaine, your surgery will be cancelled.  On the morning of surgery brush your teeth with toothpaste and water, you may rinse your mouth with mouthwash if you wish. Do not swallow any toothpaste or mouthwash.  Do not wear jewelry, make-up, hairpins, clips or nail polish.  Do not wear lotions, powders, or perfumes.   Do not shave 48 hours prior to  surgery.   Contact lenses, hearing aids and dentures may not be worn into surgery.  Do not bring valuables to the hospital. [facility] is not responsible for any missing/lost belongings or valuables.   Use CHG Soap  as directed on instruction sheet.  Notify your doctor if there is any change in your medical condition (cold, fever, infection).  Wear comfortable clothing (specific to your surgery type) to the hospital.  Plan for stool softeners for home use; pain medications have a tendency to cause constipation. You can also help prevent constipation by eating foods high in fiber such as fruits and vegetables and drinking plenty of fluids as your diet allows.  After surgery, you can help prevent lung complications by doing breathing exercises.  Take deep breaths and cough every 1-2 hours. Your doctor may order a device called an Incentive Spirometer to help you take deep breaths. When coughing or sneezing, hold a pillow firmly against your incision with both hands. This is called "splinting." Doing this helps protect your incision. It also decreases belly discomfort.  If you are being admitted to the hospital overnight, leave your suitcase in the car. After surgery it may be brought to your room.  If you are being discharged the day of surgery, you will not be allowed to drive home. You will need a responsible adult (18 years or older) to drive you home and stay with you that night.   If you are taking public transportation, you will need to have a responsible adult (18 years or  older) with you. Please confirm with your physician that it is acceptable to use public transportation.   Please call the [facility] at [phone] if you have any questions about these instructions.  Visitation Policy:  Patients undergoing a surgery or procedure may have one family member or support person with them as long as that person is not COVID-19 positive or experiencing its  symptoms.  That person may remain in the waiting area during the procedure.  Inpatient Visitation Update:   In an effort to ensure the safety of our team members and our patients, we are implementing a change to our visitation policy:  Effective Monday, Aug. 9, at 7 a.m., inpatients will be allowed one support person.  o The support person may change daily.  o The support person must pass our screening, gel in and out, and wear a mask at all times, including in the patient's room.  o Patients must also wear a mask when staff or their support person are in the room.  o Masking is required regardless of vaccination status.  Systemwide, no visitors 17 or younger.

## 2020-07-21 ENCOUNTER — Inpatient Hospital Stay: Admission: RE | Admit: 2020-07-21 | Payer: Medicare Other | Source: Ambulatory Visit

## 2020-07-22 ENCOUNTER — Other Ambulatory Visit: Payer: Self-pay

## 2020-07-22 ENCOUNTER — Other Ambulatory Visit
Admission: RE | Admit: 2020-07-22 | Discharge: 2020-07-22 | Disposition: A | Discharge status: Home / Self Care | Payer: Medicare Other | Source: Ambulatory Visit | Attending: General Surgery | Admitting: General Surgery

## 2020-07-22 ENCOUNTER — Encounter
Admission: RE | Admit: 2020-07-22 | Discharge: 2020-07-22 | Disposition: A | Discharge status: Home / Self Care | Payer: Medicare Other | Source: Ambulatory Visit

## 2020-07-22 DIAGNOSIS — Z01812 Encounter for preprocedural laboratory examination: Secondary | ICD-10-CM | POA: Insufficient documentation

## 2020-07-22 DIAGNOSIS — Z20822 Contact with and (suspected) exposure to covid-19: Secondary | ICD-10-CM | POA: Diagnosis not present

## 2020-07-22 DIAGNOSIS — E119 Type 2 diabetes mellitus without complications: Secondary | ICD-10-CM | POA: Insufficient documentation

## 2020-07-22 DIAGNOSIS — I10 Essential (primary) hypertension: Secondary | ICD-10-CM | POA: Insufficient documentation

## 2020-07-22 LAB — POTASSIUM: Potassium: 3.9 mmol/L (ref 3.5–5.1)

## 2020-07-23 LAB — SARS CORONAVIRUS 2 (TAT 6-24 HRS): SARS Coronavirus 2: NEGATIVE

## 2020-07-23 MED ORDER — SODIUM CHLORIDE 0.9 % IV SOLN: INTRAVENOUS | Status: DC

## 2020-07-23 MED ORDER — CHLORHEXIDINE GLUCONATE 0.12 % MT SOLN: 15.0000 mL | Freq: Once | OROMUCOSAL | Status: AC

## 2020-07-23 MED ORDER — CEFAZOLIN SODIUM-DEXTROSE 2-4 GM/100ML-% IV SOLN
2.0000 g | INTRAVENOUS | Status: AC
  Administered 2020-07-24: 2 g via INTRAVENOUS

## 2020-07-23 MED ORDER — ORAL CARE MOUTH RINSE: 15.0000 mL | Freq: Once | OROMUCOSAL | Status: AC

## 2020-07-24 ENCOUNTER — Ambulatory Visit: Payer: Medicare Other | Admitting: Urgent Care

## 2020-07-24 ENCOUNTER — Ambulatory Visit
Admission: RE | Admit: 2020-07-24 | Discharge: 2020-07-24 | Disposition: A | Discharge status: Home / Self Care | Payer: Medicare Other | Attending: General Surgery | Admitting: General Surgery

## 2020-07-24 ENCOUNTER — Encounter
Admission: RE | Disposition: A | Discharge status: Home / Self Care | Payer: Self-pay | Source: Home / Self Care | Attending: General Surgery

## 2020-07-24 ENCOUNTER — Ambulatory Visit: Payer: Medicare Other | Admitting: Certified Registered"

## 2020-07-24 DIAGNOSIS — K4090 Unilateral inguinal hernia, without obstruction or gangrene, not specified as recurrent: Secondary | ICD-10-CM | POA: Insufficient documentation

## 2020-07-24 HISTORY — PX: INGUINAL HERNIA REPAIR: SHX194

## 2020-07-24 LAB — GLUCOSE, CAPILLARY
Glucose-Capillary: 126 mg/dL — ABNORMAL HIGH (ref 70–99)
Glucose-Capillary: 142 mg/dL — ABNORMAL HIGH (ref 70–99)

## 2020-07-24 SURGERY — REPAIR, HERNIA, INGUINAL, ADULT
Anesthesia: General | Laterality: Right

## 2020-07-24 MED ORDER — ONDANSETRON HCL 4 MG/2ML IJ SOLN
INTRAMUSCULAR | Status: AC
  Filled 2020-07-24: qty 2

## 2020-07-24 MED ORDER — KETOROLAC TROMETHAMINE 30 MG/ML IJ SOLN
INTRAMUSCULAR | Status: DC | PRN
  Administered 2020-07-24: 15 mg via INTRAMUSCULAR

## 2020-07-24 MED ORDER — LIDOCAINE HCL 1 % IJ SOLN
INTRAMUSCULAR | Status: DC | PRN
  Administered 2020-07-24: 10 mL

## 2020-07-24 MED ORDER — DEXAMETHASONE SODIUM PHOSPHATE 10 MG/ML IJ SOLN
INTRAMUSCULAR | Status: AC
  Filled 2020-07-24: qty 1

## 2020-07-24 MED ORDER — LIDOCAINE HCL (PF) 2 % IJ SOLN
INTRAMUSCULAR | Status: AC
  Filled 2020-07-24: qty 5

## 2020-07-24 MED ORDER — PROPOFOL 10 MG/ML IV BOLUS
INTRAVENOUS | Status: AC
  Filled 2020-07-24: qty 20

## 2020-07-24 MED ORDER — BUPIVACAINE-EPINEPHRINE (PF) 0.5% -1:200000 IJ SOLN
INTRAMUSCULAR | Status: DC | PRN
  Administered 2020-07-24: 10 mL via PERINEURAL

## 2020-07-24 MED ORDER — ACETAMINOPHEN 10 MG/ML IV SOLN
INTRAVENOUS | Status: AC
  Filled 2020-07-24: qty 100

## 2020-07-24 MED ORDER — PROPOFOL 10 MG/ML IV BOLUS
INTRAVENOUS | Status: DC | PRN
  Administered 2020-07-24: 80 mg via INTRAVENOUS

## 2020-07-24 MED ORDER — FENTANYL CITRATE (PF) 100 MCG/2ML IJ SOLN: 25.0000 ug | INTRAMUSCULAR | Status: DC | PRN

## 2020-07-24 MED ORDER — CHLORHEXIDINE GLUCONATE 0.12 % MT SOLN
OROMUCOSAL | Status: AC
  Administered 2020-07-24: 15 mL via OROMUCOSAL
  Filled 2020-07-24: qty 15

## 2020-07-24 MED ORDER — DEXAMETHASONE SODIUM PHOSPHATE 10 MG/ML IJ SOLN
INTRAMUSCULAR | Status: DC | PRN
  Administered 2020-07-24: 10 mg via INTRAVENOUS

## 2020-07-24 MED ORDER — FAMOTIDINE 20 MG PO TABS
ORAL_TABLET | ORAL | Status: AC
  Filled 2020-07-24: qty 1

## 2020-07-24 MED ORDER — FENTANYL CITRATE (PF) 250 MCG/5ML IJ SOLN
INTRAMUSCULAR | Status: AC
  Filled 2020-07-24: qty 5

## 2020-07-24 MED ORDER — CEFAZOLIN SODIUM-DEXTROSE 2-4 GM/100ML-% IV SOLN
INTRAVENOUS | Status: AC
  Filled 2020-07-24: qty 100

## 2020-07-24 MED ORDER — LIDOCAINE HCL (CARDIAC) PF 100 MG/5ML IV SOSY
PREFILLED_SYRINGE | INTRAVENOUS | Status: DC | PRN
  Administered 2020-07-24: 50 mg via INTRAVENOUS

## 2020-07-24 MED ORDER — FENTANYL CITRATE (PF) 100 MCG/2ML IJ SOLN
INTRAMUSCULAR | Status: DC | PRN
  Administered 2020-07-24: 50 ug via INTRAVENOUS

## 2020-07-24 MED ORDER — EPHEDRINE SULFATE 50 MG/ML IJ SOLN
INTRAMUSCULAR | Status: DC | PRN
  Administered 2020-07-24: 5 mg via INTRAVENOUS
  Administered 2020-07-24: 20 mg via INTRAVENOUS

## 2020-07-24 MED ORDER — ACETAMINOPHEN 10 MG/ML IV SOLN
INTRAVENOUS | Status: DC | PRN
  Administered 2020-07-24: 1000 mg via INTRAVENOUS

## 2020-07-24 MED ORDER — PHENYLEPHRINE HCL (PRESSORS) 10 MG/ML IV SOLN
INTRAVENOUS | Status: DC | PRN
  Administered 2020-07-24: 100 ug via INTRAVENOUS
  Administered 2020-07-24 (×2): 200 ug via INTRAVENOUS

## 2020-07-24 MED ORDER — KETOROLAC TROMETHAMINE 30 MG/ML IJ SOLN
INTRAMUSCULAR | Status: DC | PRN
  Administered 2020-07-24: 15 mg via INTRAVENOUS

## 2020-07-24 MED ORDER — HYDROCODONE-ACETAMINOPHEN 5-325 MG PO TABS: ORAL_TABLET | ORAL | 0 refills | Status: DC

## 2020-07-24 MED ORDER — ONDANSETRON HCL 4 MG/2ML IJ SOLN: 4.0000 mg | Freq: Once | INTRAMUSCULAR | Status: DC | PRN

## 2020-07-24 SURGICAL SUPPLY — 37 items
APL PRP STRL LF DISP 70% ISPRP (MISCELLANEOUS) ×1
BLADE SURG 15 STRL SS SAFETY (BLADE) ×6 IMPLANT
CANISTER SUCT 1200ML W/VALVE (MISCELLANEOUS) IMPLANT
CHLORAPREP W/TINT 26 (MISCELLANEOUS) ×3 IMPLANT
CLOSURE WOUND 1/2 X4 (GAUZE/BANDAGES/DRESSINGS) ×1
COVER WAND RF STERILE (DRAPES) ×3 IMPLANT
DECANTER SPIKE VIAL GLASS SM (MISCELLANEOUS) IMPLANT
DRAIN PENROSE 1/4X12 LTX STRL (WOUND CARE) ×3 IMPLANT
DRAPE LAPAROTOMY 100X77 ABD (DRAPES) ×3 IMPLANT
DRSG TEGADERM 4X4.75 (GAUZE/BANDAGES/DRESSINGS) ×3 IMPLANT
DRSG TELFA 4X3 1S NADH ST (GAUZE/BANDAGES/DRESSINGS) ×3 IMPLANT
ELECT CAUTERY BLADE 6.4 (BLADE) ×3 IMPLANT
ELECT REM PT RETURN 9FT ADLT (ELECTROSURGICAL) ×3
ELECTRODE REM PT RTRN 9FT ADLT (ELECTROSURGICAL) ×1 IMPLANT
GLOVE BIO SURGEON STRL SZ7.5 (GLOVE) ×3 IMPLANT
GLOVE INDICATOR 8.0 STRL GRN (GLOVE) ×3 IMPLANT
GOWN STRL REUS W/ TWL LRG LVL3 (GOWN DISPOSABLE) ×2 IMPLANT
GOWN STRL REUS W/TWL LRG LVL3 (GOWN DISPOSABLE) ×6
KIT TURNOVER KIT A (KITS) ×3 IMPLANT
LABEL OR SOLS (LABEL) IMPLANT
MESH HERNIA 6X12 ULTRAPRO MED (Mesh General) ×1 IMPLANT
MESH HERNIA ULTRAPRO MED (Mesh General) ×2 IMPLANT
NEEDLE HYPO 22GX1.5 SAFETY (NEEDLE) ×6 IMPLANT
PACK BASIN MINOR (MISCELLANEOUS) ×3 IMPLANT
STRIP CLOSURE SKIN 1/2X4 (GAUZE/BANDAGES/DRESSINGS) ×2 IMPLANT
SUT PDS AB 0 CT1 27 (SUTURE) ×3 IMPLANT
SUT SURGILON 0 BLK (SUTURE) ×6 IMPLANT
SUT VIC AB 2-0 SH 27 (SUTURE) ×3
SUT VIC AB 2-0 SH 27XBRD (SUTURE) ×1 IMPLANT
SUT VIC AB 3-0 54X BRD REEL (SUTURE) ×1 IMPLANT
SUT VIC AB 3-0 BRD 54 (SUTURE) ×3
SUT VIC AB 3-0 SH 27 (SUTURE) ×3
SUT VIC AB 3-0 SH 27X BRD (SUTURE) ×1 IMPLANT
SUT VIC AB 4-0 FS2 27 (SUTURE) ×3 IMPLANT
SWABSTK COMLB BENZOIN TINCTURE (MISCELLANEOUS) ×3 IMPLANT
SYR 10ML LL (SYRINGE) ×3 IMPLANT
SYR 3ML LL SCALE MARK (SYRINGE) ×3 IMPLANT

## 2020-07-24 NOTE — OR Nursing (Signed)
Discharge pending patient void.

## 2020-07-24 NOTE — Op Note (Signed)
Preoperative diagnosis: Symptomatic right inguinal hernia.  Postoperative diagnosis: Same.  Operative procedure: Right indirect inguinal hernia repair with medium Ultra Pro mesh.  Operating surgeon: Hervey Ard, MD.  Anesthesia: General by LMA, Marcaine 0.25% with Xylocaine 0.5% with 1: 400,000 units of epinephrine, 30 cc.  Toradol: 15 mg.  Estimated blood loss: Less than 5 cc.  Clinical note: This 74 year old woman is developed a symptomatic inguinal hernia.  She was admitted for elective repair.  SCD stockings for DVT prevention.  She received Ancef 2 g intravenously prior to surgery and has antibiotic prophylaxis.  Operative note: With the patient under adequate general anesthesia the right groin was cleansed with ChloraPrep and draped.  Field block anesthesia was established for postoperative analgesia.  A 5 cm skin line incision along the anticipated course the inguinal canal was made and carried down through skin subtendinous tissue.  Hemostasis was electrocautery.  The external oblique fascia was opened in the direction of its fibers.  The iliohypogastric nerve was identified and protected.  The ilioinguinal nerve was not seen.  A moderate size indirect hernia was identified.  The round ligament was ligated with a 3-0 Vicryl tie.  The hernia sac was then dissected free of the adjacent tissues and returned to the preperitoneal space.  A medium Ultra Pro mesh was smoothed into position having trimmed it inch off the both the medial and lateral aspects to provide a better fit.  This was smoothed into the preperitoneal space and the external component smooth along the inguinal canal.  This was anchored to the pubic tubercle and inguinal ligament with interrupted 0 Surgilon sutures.  The medial and superior borders were anchored to the transverse abdominis aponeurosis with a similar interrupted sutures.  15 mg of Toradol was placed in the wound for postoperative comfort.  The external Bleich  fascia was closed with a running 2-0 Vicryl suture.  Scarpa's fascia was closed with a running 3-0 Vicryl suture.  The skin closed with a running 4-0 Vicryl subcuticular suture.  Benzoin Steri-Strips followed by Telfa Tegaderm dressing was applied.  The patient tolerated the procedure well and was taken to recovery room in stable condition.

## 2020-07-24 NOTE — Anesthesia Preprocedure Evaluation (Signed)
Anesthesia Evaluation  Patient identified by MRN, date of birth, ID band Patient awake    Reviewed: Allergy & Precautions, NPO status , Patient's Chart, lab work & pertinent test results  History of Anesthesia Complications Negative for: history of anesthetic complications  Airway Mallampati: II       Dental   Pulmonary neg sleep apnea, neg COPD, Not current smoker,           Cardiovascular hypertension, Pt. on medications (-) Past MI and (-) CHF (-) dysrhythmias (-) Valvular Problems/Murmurs     Neuro/Psych neg Seizures    GI/Hepatic Neg liver ROS, neg GERD  ,  Endo/Other  diabetes, Type 2, Oral Hypoglycemic Agents  Renal/GU negative Renal ROS     Musculoskeletal   Abdominal   Peds  Hematology   Anesthesia Other Findings   Reproductive/Obstetrics                             Anesthesia Physical Anesthesia Plan  ASA: III  Anesthesia Plan: General   Post-op Pain Management:    Induction: Intravenous  PONV Risk Score and Plan: 3 and Ondansetron, Dexamethasone and Treatment may vary due to age or medical condition  Airway Management Planned: LMA  Additional Equipment:   Intra-op Plan:   Post-operative Plan:   Informed Consent: I have reviewed the patients History and Physical, chart, labs and discussed the procedure including the risks, benefits and alternatives for the proposed anesthesia with the patient or authorized representative who has indicated his/her understanding and acceptance.       Plan Discussed with:   Anesthesia Plan Comments:         Anesthesia Quick Evaluation

## 2020-07-24 NOTE — OR Nursing (Signed)
Dr. Bary Castilla in to see pt in postop @ 1555.

## 2020-07-24 NOTE — Discharge Instructions (Signed)

## 2020-07-24 NOTE — H&P (Signed)
Cassandra Parks [id number] [birth year]     HPI:  74 y/o woman with symptomatic right inguinal hernia. For repair.   Medications Prior to Admission  Medication Sig Dispense Refill Last Dose  . FARXIGA 5 MG TABS tablet Take 5 mg by mouth every morning.   07/23/2020 at Unknown time  . losartan (COZAAR) 25 MG tablet Take 25 mg by mouth every morning.    07/23/2020 at Unknown time  . triamterene-hydrochlorothiazide (MAXZIDE-25) 37.5-25 MG tablet Take 1 tablet by mouth daily.     . Vitamin D, Ergocalciferol, (DRISDOL) 1.25 MG (50000 UNIT) CAPS capsule Take 50,000 Units by mouth once a week. (Patient not taking: Reported on 07/16/2020)     . famotidine (PEPCID) 20 MG tablet Take 1 tablet (20 mg total) by mouth 2 (two) times daily. (Patient not taking: Reported on 06/01/2020) 8 tablet 0   . Na Sulfate-K Sulfate-Mg Sulf 17.5-3.13-1.6 GM/177ML SOLN Take 1 Bottle by mouth. (Patient not taking: Reported on 07/16/2020)     . predniSONE (DELTASONE) 20 MG tablet 3 tablets daily x 4 days (Patient not taking: Reported on 06/01/2020) 12 tablet 0    No Known Allergies Past Medical History:  Diagnosis Date  . Arthritis    hands  . Diabetes mellitus without complication ([place])   . Hypertension   . Palsy, Bell's    Past Surgical History:  Procedure Laterality Date  . BREAST BIOPSY Left    negative  . BUNIONECTOMY    . COLONOSCOPY WITH PROPOFOL N/A 06/01/2020   Procedure: COLONOSCOPY WITH PROPOFOL;  Surgeon: Lesly Rubenstein, MD;  Location: [facility] ENDOSCOPY;  Service: Endoscopy;  Laterality: N/A;  . ESOPHAGOGASTRODUODENOSCOPY (EGD) WITH PROPOFOL N/A 06/01/2020   Procedure: ESOPHAGOGASTRODUODENOSCOPY (EGD) WITH PROPOFOL;  Surgeon: Lesly Rubenstein, MD;  Location: [facility] ENDOSCOPY;  Service: Endoscopy;  Laterality: N/A;  . FINGER SURGERY Left    Social History   Socioeconomic History  . Marital status: Married    Spouse name: Not on file  . Number of children: Not on file  . Years of education: Not on  file  . Highest education level: Not on file  Occupational History  . Not on file  Tobacco Use  . Smoking status: Never Smoker  . Smokeless tobacco: Never Used  Vaping Use  . Vaping Use: Never used  Substance and Sexual Activity  . Alcohol use: No  . Drug use: Never  . Sexual activity: Not on file  Other Topics Concern  . Not on file  Social History Narrative  . Not on file   Social Determinants of Health   Financial Resource Strain:   . Difficulty of Paying Living Expenses: Not on file  Food Insecurity:   . Worried About Charity fundraiser in the Last Year: Not on file  . Ran Out of Food in the Last Year: Not on file  Transportation Needs:   . Lack of Transportation (Medical): Not on file  . Lack of Transportation (Non-Medical): Not on file  Physical Activity:   . Days of Exercise per Week: Not on file  . Minutes of Exercise per Session: Not on file  Stress:   . Feeling of Stress : Not on file  Social Connections:   . Frequency of Communication with Friends and Family: Not on file  . Frequency of Social Gatherings with Friends and Family: Not on file  . Attends Religious Services: Not on file  . Active Member of Clubs or Organizations: Not on file  . Attends  Club or Organization Meetings: Not on file  . Marital Status: Not on file  Intimate Partner Violence:   . Fear of Current or Ex-Partner: Not on file  . Emotionally Abused: Not on file  . Physically Abused: Not on file  . Sexually Abused: Not on file   Social History   Social History Narrative  . Not on file     ROS: Negative.     PE: HEENT: Negative. Lungs: Clear. Cardio: RR.   Assessment/Plan:  Proceed with planned right inguinal hernia repair.    Forest Gleason [facility] 07/24/2020

## 2020-07-24 NOTE — Transfer of Care (Signed)
Immediate Anesthesia Transfer of Care Note  Patient: Cassandra Parks  Procedure(s) Performed: HERNIA REPAIR INGUINAL ADULT (Right )  Patient Location: PACU  Anesthesia Type:General    Level of Consciousness: awake and drowsy  Airway & Oxygen Therapy: Patient Spontanous Breathing and Patient connected to face mask oxygen  Post-op Assessment: Report given to RN and Post -op Vital signs reviewed and stable  Post vital signs: Reviewed and stable  Last Vitals:  Vitals Value Taken Time  BP 117/65 07/24/20 1512  Temp    Pulse 83 07/24/20 1518  Resp 14 07/24/20 1518  SpO2 100 % 07/24/20 1518  Vitals shown include unvalidated device data.  Last Pain:  Vitals:   07/24/20 1512  PainSc: (P) 0-No pain         Complications: No complications documented.

## 2020-07-27 ENCOUNTER — Encounter: Payer: Self-pay | Admitting: General Surgery

## 2020-08-10 NOTE — Anesthesia Postprocedure Evaluation (Signed)
Anesthesia Post Note  Patient: Cassandra Parks  Procedure(s) Performed: HERNIA REPAIR INGUINAL ADULT (Right )  Patient location during evaluation: PACU Anesthesia Type: General Level of consciousness: awake and alert Pain management: pain level controlled Vital Signs Assessment: post-procedure vital signs reviewed and stable Respiratory status: spontaneous breathing, nonlabored ventilation, respiratory function stable and patient connected to nasal cannula oxygen Cardiovascular status: blood pressure returned to baseline and stable Postop Assessment: no apparent nausea or vomiting Anesthetic complications: no   No complications documented.   Last Vitals:  Vitals:   07/24/20 1557 07/24/20 1632  BP: 112/65 129/72  Pulse: 73 77  Resp: 14 16  Temp: (!) 36.1 C   SpO2: 100% 96%    Last Pain:  Vitals:   07/24/20 1632  TempSrc:   PainSc: 0-No pain                 Molli Barrows

## 2021-04-15 ENCOUNTER — Other Ambulatory Visit: Payer: Self-pay | Admitting: Internal Medicine

## 2021-04-15 DIAGNOSIS — Z1231 Encounter for screening mammogram for malignant neoplasm of breast: Secondary | ICD-10-CM

## 2021-04-22 ENCOUNTER — Other Ambulatory Visit: Payer: Self-pay

## 2021-04-22 ENCOUNTER — Ambulatory Visit
Admission: RE | Admit: 2021-04-22 | Discharge: 2021-04-22 | Disposition: A | Discharge status: Home / Self Care | Payer: Medicare Other | Source: Ambulatory Visit | Attending: Internal Medicine | Admitting: Internal Medicine

## 2021-04-22 DIAGNOSIS — Z1231 Encounter for screening mammogram for malignant neoplasm of breast: Secondary | ICD-10-CM | POA: Diagnosis present

## 2021-05-10 ENCOUNTER — Other Ambulatory Visit
Admission: RE | Admit: 2021-05-10 | Discharge: 2021-05-10 | Disposition: A | Discharge status: Home / Self Care | Payer: Medicare Other | Attending: Family Medicine | Admitting: Family Medicine

## 2021-05-10 DIAGNOSIS — E1122 Type 2 diabetes mellitus with diabetic chronic kidney disease: Secondary | ICD-10-CM | POA: Insufficient documentation

## 2021-05-10 DIAGNOSIS — N1832 Chronic kidney disease, stage 3b: Secondary | ICD-10-CM | POA: Diagnosis not present

## 2021-05-10 DIAGNOSIS — N2581 Secondary hyperparathyroidism of renal origin: Secondary | ICD-10-CM | POA: Diagnosis not present

## 2021-05-10 DIAGNOSIS — I129 Hypertensive chronic kidney disease with stage 1 through stage 4 chronic kidney disease, or unspecified chronic kidney disease: Secondary | ICD-10-CM | POA: Insufficient documentation

## 2021-05-10 LAB — BASIC METABOLIC PANEL
Anion gap: 5 (ref 5–15)
BUN: 35 mg/dL — ABNORMAL HIGH (ref 8–23)
CO2: 29 mmol/L (ref 22–32)
Calcium: 9.5 mg/dL (ref 8.9–10.3)
Chloride: 104 mmol/L (ref 98–111)
Creatinine, Ser: 1.65 mg/dL — ABNORMAL HIGH (ref 0.44–1.00)
GFR, Estimated: 32 mL/min — ABNORMAL LOW (ref >60)
Glucose, Bld: 119 mg/dL — ABNORMAL HIGH (ref 70–99)
Potassium: 4.2 mmol/L (ref 3.5–5.1)
Sodium: 138 mmol/L (ref 135–145)

## 2021-05-10 LAB — CBC
HCT: 39.4 % (ref 36.0–46.0)
Hemoglobin: 12.9 g/dL (ref 12.0–15.0)
MCH: 30.4 pg (ref 26.0–34.0)
MCHC: 32.7 g/dL (ref 30.0–36.0)
MCV: 92.7 fL (ref 80.0–100.0)
Platelets: 164 10*3/uL (ref 150–400)
RBC: 4.25 MIL/uL (ref 3.87–5.11)
RDW: 14.7 % (ref 11.5–15.5)
WBC: 5.5 10*3/uL (ref 4.0–10.5)
nRBC: 0 % (ref 0.0–0.2)

## 2021-05-10 LAB — PHOSPHORUS: Phosphorus: 3.5 mg/dL (ref 2.5–4.6)

## 2021-05-10 LAB — HEMOGLOBIN A1C
Hgb A1c MFr Bld: 7.6 % — ABNORMAL HIGH (ref 4.8–5.6)
Mean Plasma Glucose: 171.42 mg/dL

## 2021-05-11 LAB — MICROALBUMIN / CREATININE URINE RATIO
Creatinine, Urine: 78.1 mg/dL
Microalb Creat Ratio: 4 mg/g creat (ref 0–29)
Microalb, Ur: 3.3 ug/mL — ABNORMAL HIGH

## 2021-05-11 LAB — PARATHYROID HORMONE, INTACT (NO CA): PTH: 42 pg/mL (ref 15–65)

## 2021-05-13 LAB — 25-HYDROXY VITAMIN D LCMS D2+D3
25-Hydroxy, Vitamin D-2: 4.7 ng/mL
25-Hydroxy, Vitamin D-3: 53 ng/mL
25-Hydroxy, Vitamin D: 58 ng/mL

## 2022-01-28 ENCOUNTER — Other Ambulatory Visit: Payer: Self-pay | Admitting: Internal Medicine

## 2022-01-28 DIAGNOSIS — R634 Abnormal weight loss: Secondary | ICD-10-CM

## 2022-01-31 ENCOUNTER — Other Ambulatory Visit: Payer: Self-pay | Admitting: Internal Medicine

## 2022-01-31 DIAGNOSIS — R634 Abnormal weight loss: Secondary | ICD-10-CM

## 2022-02-04 ENCOUNTER — Ambulatory Visit
Admission: RE | Admit: 2022-02-04 | Discharge: 2022-02-04 | Disposition: A | Discharge status: Home / Self Care | Payer: Medicare PPO | Source: Ambulatory Visit | Attending: Internal Medicine | Admitting: Internal Medicine

## 2022-02-04 DIAGNOSIS — R634 Abnormal weight loss: Secondary | ICD-10-CM | POA: Diagnosis present

## 2022-07-23 IMAGING — CT CT ABD-PELV W/ CM
2 of 5 series · 16 of 46 positions shown, 18 images · IV contrast (omnipaque)
Comparison: None.

CLINICAL DATA: Right lower quadrant not and swelling for 2 months,
hernia suspected

EXAM:
CT ABDOMEN AND PELVIS WITH CONTRAST
TECHNIQUE: Multidetector CT imaging of the abdomen and pelvis was performed
using the standard protocol following bolus administration of
intravenous contrast.
CONTRAST:  75mL OMNIPAQUE IOHEXOL 300 MG/ML SOLN, additional oral
enteric contrast

[Series 2: abd pelvis 5.00 · axial · 0.56mm/px · z∈[-1398,-1033]mm · 13 of 83 slices shown, 15 images]
[im 5/83  soft-tissue]
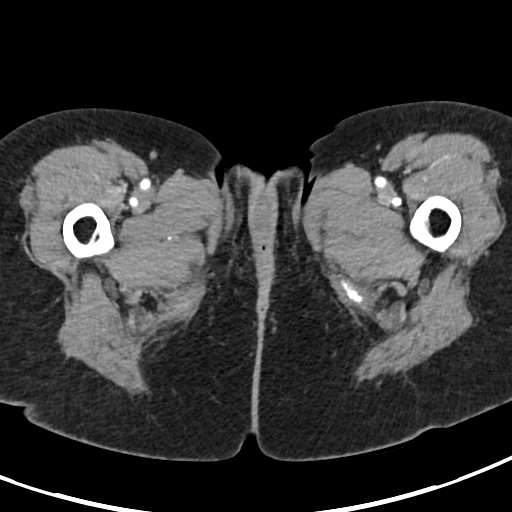
[im 5/83  bone]
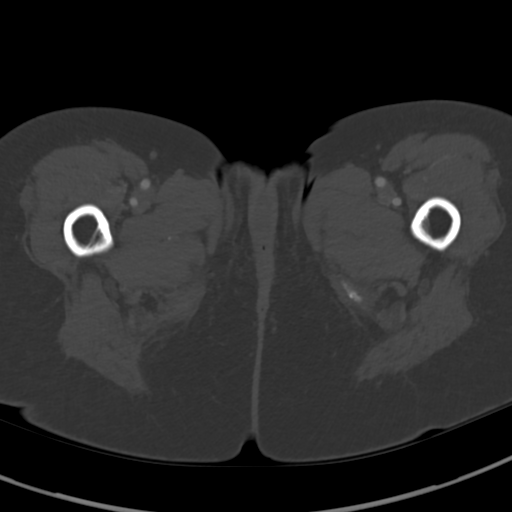
[im 10/83  soft-tissue]
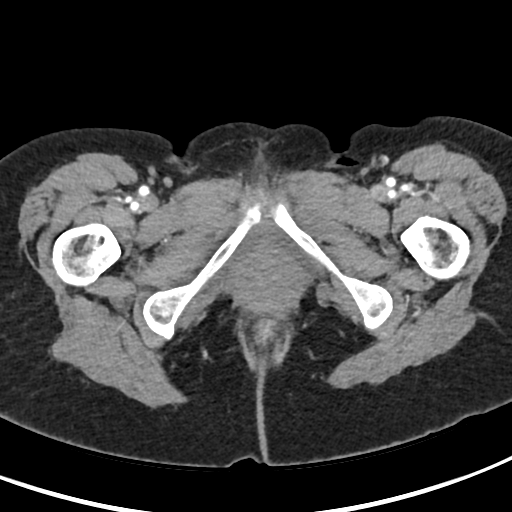
[im 19/83  soft-tissue]
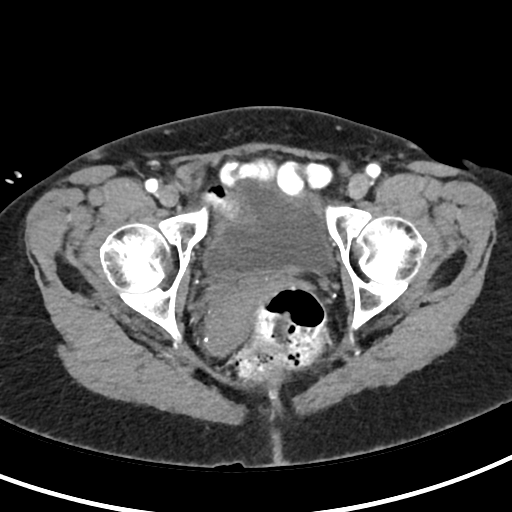
[im 23/83  soft-tissue]
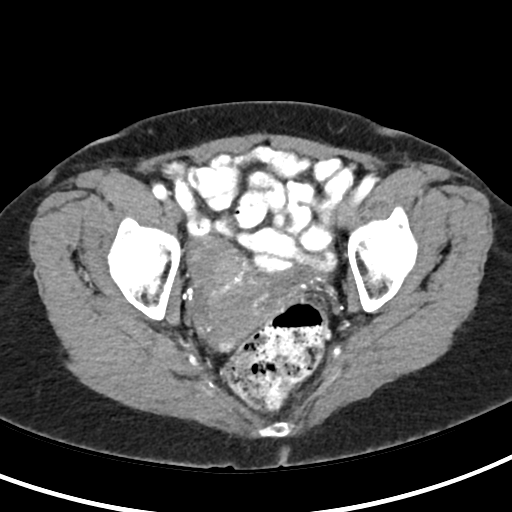
[im 28/83  soft-tissue]
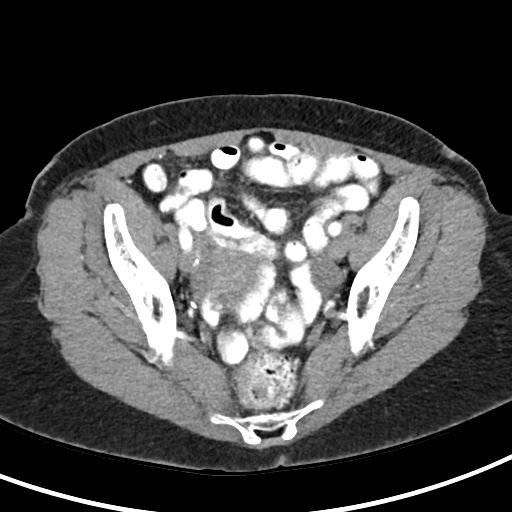
[im 37/83  soft-tissue]
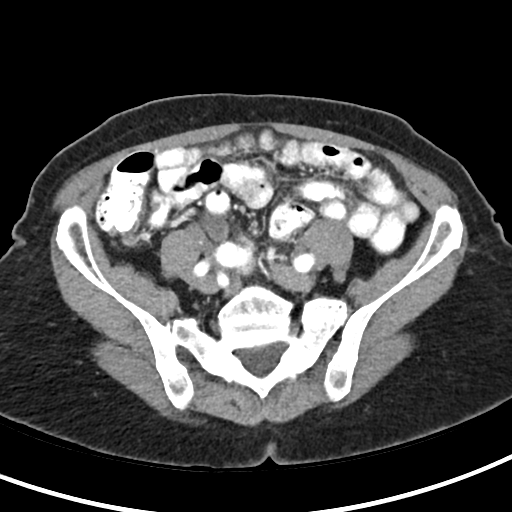
[im 42/83  soft-tissue]
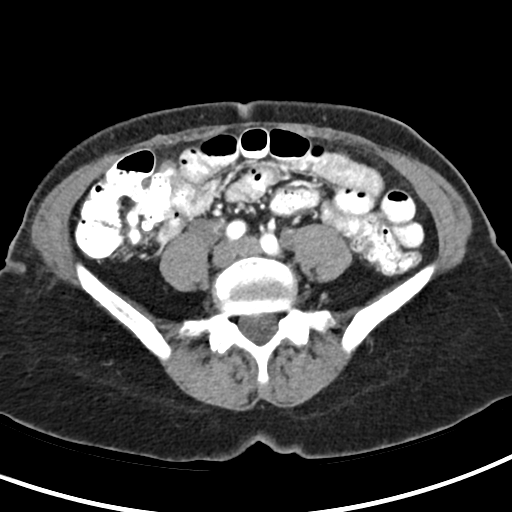
[im 46/83  soft-tissue]
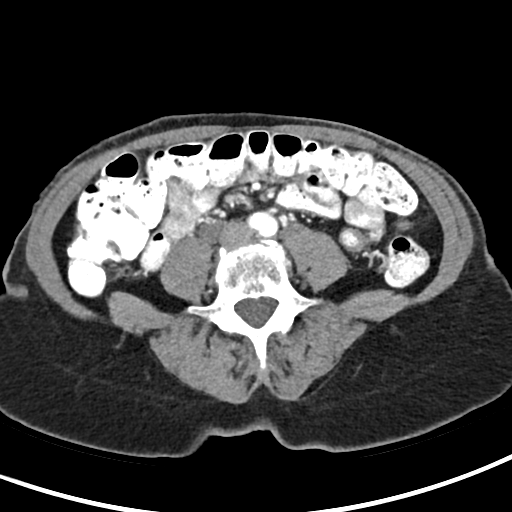
[im 55/83  soft-tissue]
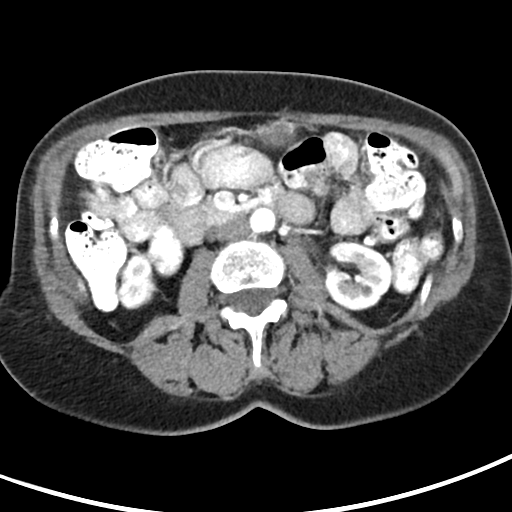
[im 55/83  bone]
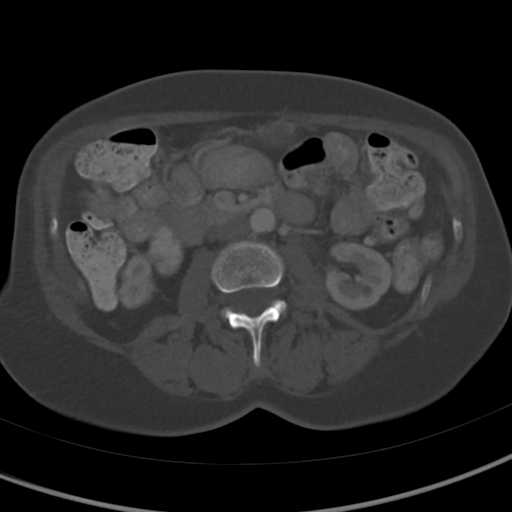
[im 60/83  soft-tissue]
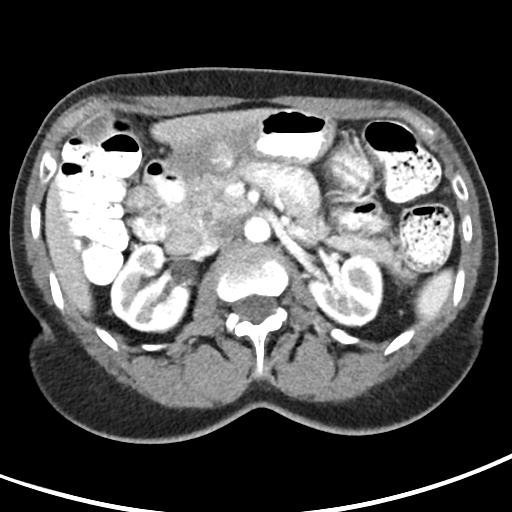
[im 64/83  soft-tissue]
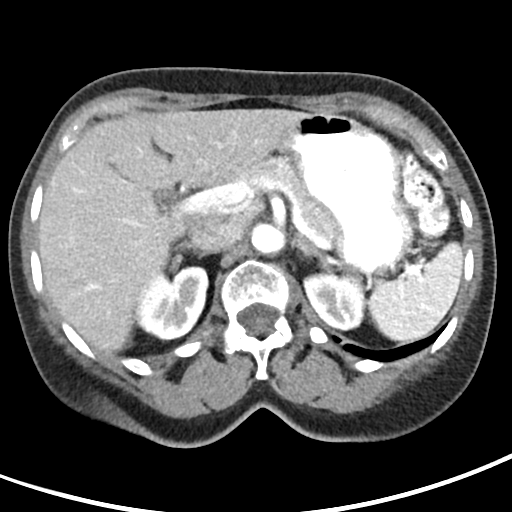
[im 73/83  soft-tissue]
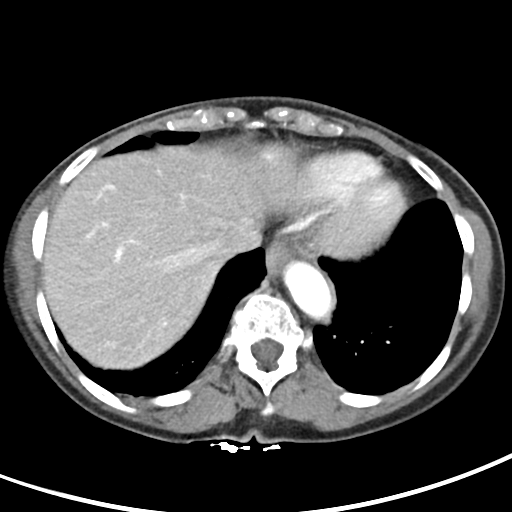
[im 78/83  soft-tissue]
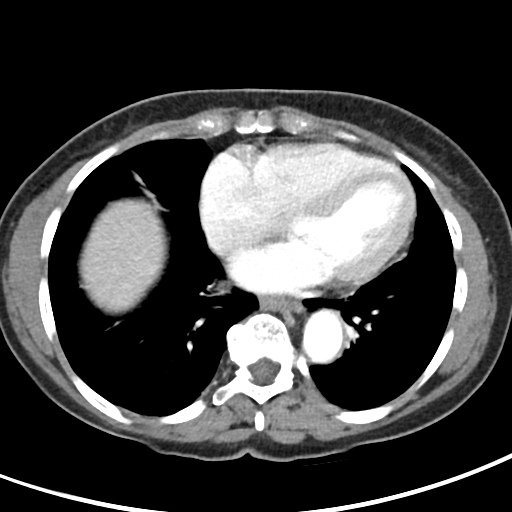

[Series 4: coronals abd pelvis 2.00 cor · coronal · 0.56mm/px · 3 of 116 slices shown]
[im 39/116  soft-tissue]
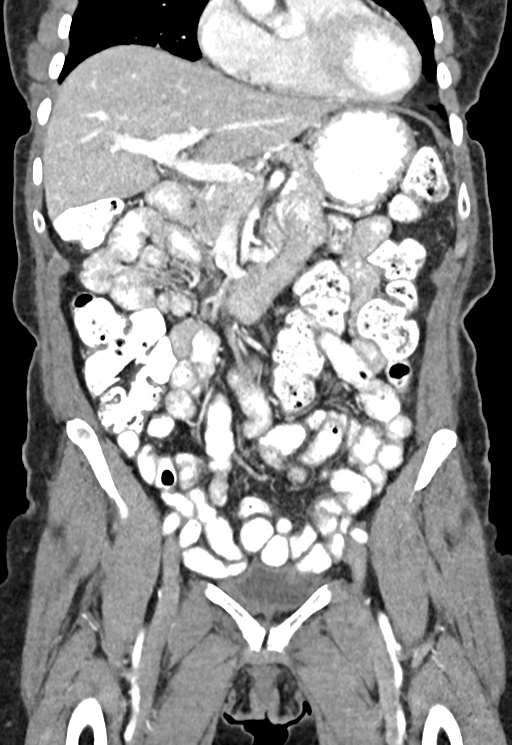
[im 52/116  soft-tissue]
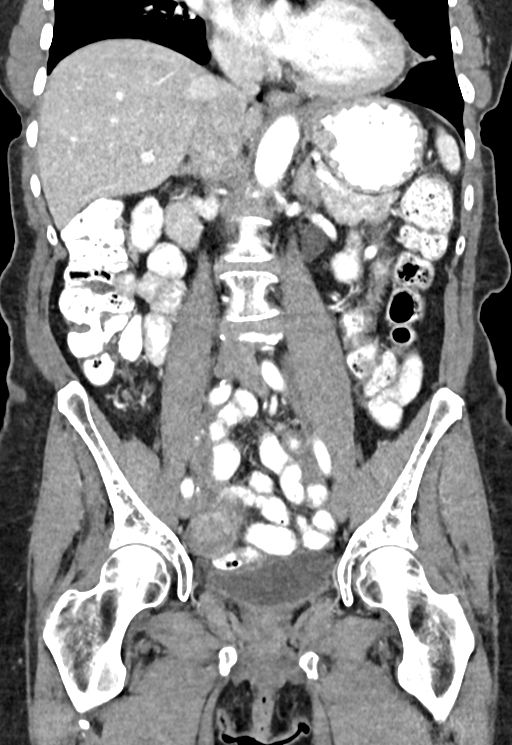
[im 64/116  soft-tissue]
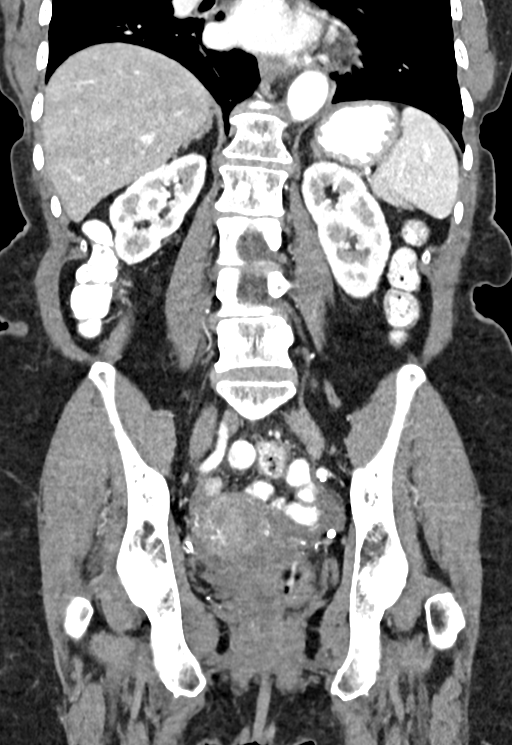

[16 of 46 positions shown; findings below may reference images not displayed]

FINDINGS: Lower chest: No acute abnormality.  Mild bibasilar scarring.

Hepatobiliary: No solid liver abnormality is seen. No gallstones,
gallbladder wall thickening, or biliary dilatation.

Pancreas: Unremarkable. No pancreatic ductal dilatation or
surrounding inflammatory changes.

Spleen: Normal in size without significant abnormality.

Adrenals/Urinary Tract: Adrenal glands are unremarkable. Kidneys are
normal, without renal calculi, solid lesion, or hydronephrosis.
Bladder is unremarkable.

Stomach/Bowel: Stomach is within normal limits. Appendix appears
normal. No evidence of bowel wall thickening, distention, or
inflammatory changes.

Vascular/Lymphatic: Aortic atherosclerosis. No enlarged abdominal or
pelvic lymph nodes.

Reproductive: No mass or other significant abnormality.

Other: There are small, right greater than left fat containing
inguinal hernias (series 2, image 69). No abdominopelvic ascites.

Musculoskeletal: No acute or significant osseous findings.
IMPRESSION: 1. There are small, right greater than left fat containing inguinal
hernias.
2. Aortic Atherosclerosis ([id number]-[id number]).

## 2022-08-02 ENCOUNTER — Other Ambulatory Visit: Payer: Self-pay | Admitting: Internal Medicine

## 2022-08-02 DIAGNOSIS — Z1231 Encounter for screening mammogram for malignant neoplasm of breast: Secondary | ICD-10-CM

## 2022-08-18 ENCOUNTER — Ambulatory Visit
Admission: RE | Admit: 2022-08-18 | Discharge: 2022-08-18 | Disposition: A | Discharge status: Home / Self Care | Payer: Medicare PPO | Source: Ambulatory Visit | Attending: Internal Medicine | Admitting: Internal Medicine

## 2022-08-18 DIAGNOSIS — Z1231 Encounter for screening mammogram for malignant neoplasm of breast: Secondary | ICD-10-CM | POA: Insufficient documentation

## 2023-07-19 ENCOUNTER — Other Ambulatory Visit: Payer: Self-pay | Admitting: Internal Medicine

## 2023-07-19 DIAGNOSIS — Z1231 Encounter for screening mammogram for malignant neoplasm of breast: Secondary | ICD-10-CM

## 2023-08-21 ENCOUNTER — Ambulatory Visit
Admission: RE | Admit: 2023-08-21 | Discharge: 2023-08-21 | Disposition: A | Discharge status: Home / Self Care | Payer: Medicare PPO | Source: Ambulatory Visit | Attending: Internal Medicine | Admitting: Internal Medicine

## 2023-08-21 DIAGNOSIS — Z1231 Encounter for screening mammogram for malignant neoplasm of breast: Secondary | ICD-10-CM | POA: Diagnosis present

## 2023-09-05 ENCOUNTER — Encounter: Payer: Self-pay | Admitting: Podiatry

## 2023-09-05 ENCOUNTER — Ambulatory Visit: Payer: Self-pay | Admitting: Podiatry

## 2023-09-05 VITALS — Ht 61.0 in | Wt 115.0 lb

## 2023-09-05 DIAGNOSIS — M79675 Pain in left toe(s): Secondary | ICD-10-CM | POA: Diagnosis not present

## 2023-09-05 DIAGNOSIS — M79674 Pain in right toe(s): Secondary | ICD-10-CM | POA: Diagnosis not present

## 2023-09-05 DIAGNOSIS — B351 Tinea unguium: Secondary | ICD-10-CM | POA: Diagnosis not present

## 2023-09-05 NOTE — Progress Notes (Unsigned)
  Subjective:  Patient ID: Cassandra Parks, female    DOB: [birth year],  MRN: [id number]  Chief Complaint  Patient presents with   Nail Problem    NP here for RFC   77 y.o. female returns for the above complaint.  Patient presents with thickened and onychodystrophy mycotic toenails x 10 mild pain on palpation worse with ambulation worse with pressure patient would like for me debride down she is not able to herself denies any other acute complaints.  Objective:  There were no vitals filed for this visit. Podiatric Exam: Vascular: dorsalis pedis and posterior tibial pulses are palpable bilateral. Capillary return is immediate. Temperature gradient is WNL. Skin turgor WNL  Sensorium: Normal Semmes Weinstein monofilament test. Normal tactile sensation bilaterally. Nail Exam: Pt has thick disfigured discolored nails with subungual debris noted bilateral entire nail hallux through fifth toenails.  Pain on palpation to the nails. Ulcer Exam: There is no evidence of ulcer or pre-ulcerative changes or infection. Orthopedic Exam: Muscle tone and strength are WNL. No limitations in general ROM. No crepitus or effusions noted.  Skin: No Porokeratosis. No infection or ulcers    Assessment & Plan:   1. Pain due to onychomycosis of toenails of both feet     Patient was evaluated and treated and all questions answered.  Onychomycosis with pain  -Nails palliatively debrided as below. -Educated on self-care  Procedure: Nail Debridement Rationale: pain  Type of Debridement: manual, sharp debridement. Instrumentation: Nail nipper, rotary burr. Number of Nails: 10  Procedures and Treatment: Consent by patient was obtained for treatment procedures. The patient understood the discussion of treatment and procedures well. All questions were answered thoroughly reviewed. Debridement of mycotic and hypertrophic toenails, 1 through 5 bilateral and clearing of subungual debris. No ulceration, no infection  noted.  Return Visit-Office Procedure: Patient instructed to return to the office for a follow up visit 3 months for continued evaluation and treatment.  Nicholes Rough, DPM    No follow-ups on file.

## 2023-12-04 ENCOUNTER — Encounter: Payer: Self-pay | Admitting: Podiatry

## 2023-12-04 ENCOUNTER — Ambulatory Visit: Payer: Medicare PPO | Admitting: Podiatry

## 2023-12-04 VITALS — Ht 61.0 in | Wt 115.0 lb

## 2023-12-04 DIAGNOSIS — M79674 Pain in right toe(s): Secondary | ICD-10-CM | POA: Diagnosis not present

## 2023-12-04 DIAGNOSIS — M79675 Pain in left toe(s): Secondary | ICD-10-CM | POA: Diagnosis not present

## 2023-12-04 DIAGNOSIS — B351 Tinea unguium: Secondary | ICD-10-CM

## 2023-12-04 NOTE — Progress Notes (Signed)
  Subjective:  Patient ID: Cassandra Parks, female    DOB: 02/11/1946,  MRN: [id number]  78 y.o. female presents with painful, elongated thickened toenails x 10 which are symptomatic when wearing enclosed shoe gear. This interferes with his/her daily activities. Patient states her right great toe is tender on today's visit. Chief Complaint  Patient presents with   Nail Problem    Pt is here for [facility] unsure of last A1C PCP is Dr Franco Collet and LOV was in September.    PCP: Jaclyn Shaggy, MD.  Review of Systems: Negative except as noted in the HPI.   Allergies  Allergen Reactions   Shellfish Allergy    Sulfa Antibiotics Cough    Objective:  There were no vitals filed for this visit. Constitutional Patient is a pleasant 78 y.o. female WD, WN in NAD. AAO x 3.  Vascular Capillary fill time to digits <3 seconds.  DP/PT pulse(s) are faintly palpable b/l lower extremities. Pedal hair absent b/l. Lower extremity skin temperature gradient warm to cool b/l. No pain with calf compression b/l. No cyanosis or clubbing noted. No ischemia nor gangrene noted b/l.   Neurologic Protective sensation intact 5/5 intact bilaterally with 10g monofilament b/l. Vibratory sensation intact b/l. No clonus b/l.   Dermatologic Pedal skin is thin, shiny and atrophic b/l.  No open wounds b/l lower extremities. No interdigital macerations b/l lower extremities. Toenails 1-5 b/l elongated, discolored, dystrophic, thickened, crumbly with subungual debris and tenderness to dorsal palpation.  Subungual hyperkeratotic lesion right great toe medial border.  Orthopedic: Normal muscle strength 5/5 to all lower extremity muscle groups bilaterally. No pain, crepitus or joint limitation noted with ROM bilateral LE. No gross bony deformities bilaterally.   Assessment:   1. Pain due to onychomycosis of toenails of both feet    Plan:  -Consent given for treatment as described below: -Examined patient. -Patient to continue soft,  supportive shoe gear daily. -Toenails 1-5 b/l were debrided in length and girth with sterile nail nippers and dremel without iatrogenic bleeding.  -As a courtesy, callus(es) subungually medial aspect right great toe pared utilizing sterile scalpel blade without complication or incident. Total number debrided =1. -Patient/POA to call should there be question/concern in the interim.  Return in about 3 months (around 03/05/2024).  Freddie Breech, DPM       LOCATION: [address] [place] [address]                   Office [phone]   [facility] LOCATION: [address] [place], [place] [address] Office [phone]

## 2024-03-08 ENCOUNTER — Encounter: Payer: Self-pay | Admitting: Podiatry

## 2024-03-08 ENCOUNTER — Ambulatory Visit: Admitting: Podiatry

## 2024-03-08 VITALS — Ht 61.0 in | Wt 115.0 lb

## 2024-03-08 DIAGNOSIS — M79675 Pain in left toe(s): Secondary | ICD-10-CM

## 2024-03-08 DIAGNOSIS — B351 Tinea unguium: Secondary | ICD-10-CM

## 2024-03-08 DIAGNOSIS — M79674 Pain in right toe(s): Secondary | ICD-10-CM | POA: Diagnosis not present

## 2024-03-15 ENCOUNTER — Encounter: Payer: Self-pay | Admitting: Podiatry

## 2024-03-15 NOTE — Progress Notes (Signed)
  Subjective:  Patient ID: Cassandra Parks, female    DOB: 04-19-1946,  MRN: [id number]  Cassandra Parks presents to clinic today for painful elongated mycotic toenails 1-5 bilaterally which are tender when wearing enclosed shoe gear. Pain is relieved with periodic professional debridement.  Chief Complaint  Patient presents with   Nail Problem    Pt is here for [facility] unsure of last A1C PCP is Dr Arabella Knife and LOV was a few months ago.   New problem(s): None.   PCP is Westley Hammers, MD.  Allergies  Allergen Reactions   Shellfish Allergy    Sulfa Antibiotics Cough    Review of Systems: Negative except as noted in the HPI.  Objective: No changes noted in today's physical examination. There were no vitals filed for this visit. Cassandra Parks is a pleasant 78 y.o. female thin build in NAD. AAO x 3.  Vascular Examination: Capillary refill time immediate b/l. Palpable pedal pulses. Pedal hair present b/l. No pain with calf compression b/l. Skin temperature gradient WNL b/l. No cyanosis or clubbing b/l. No ischemia or gangrene noted b/l. No edema noted b/l LE.  Neurological Examination: Sensation grossly intact b/l with 10 gram monofilament. Vibratory sensation intact b/l.   Dermatological Examination: No open wounds. No interdigital macerations.   Toenails 1-5 b/l thick, discolored, elongated with subungual debris and pain on dorsal palpation.   No corns, calluses nor porokeratotic lesions noted.  Musculoskeletal Examination: Muscle strength 5/5 to all lower extremity muscle groups bilaterally. No pain, crepitus or joint limitation noted with ROM bilateral LE. No gross bony deformities bilaterally.  Radiographs: None  Assessment/Plan: 1. Pain due to onychomycosis of toenails of both feet   Consent given for treatment. Patient examined. All patient's and/or POA's questions/concerns addressed on today's visit.Toenails 1-5 bilaterally  debrided in length and girth without  incident.Continue soft, supportive shoe gear daily. Report any pedal injuries to medical professional. Call office if there are any questions/concerns. -Patient/POA to call should there be question/concern in the interim.   Return in about 3 months (around 06/08/2024).  Luella Sager, DPM      [place] LOCATION: [address], [place] [address]                   Office [phone]   [facility] LOCATION: [address] [place], [place] [address] Office [phone]

## 2024-03-19 LAB — HM DIABETES EYE EXAM

## 2024-04-26 ENCOUNTER — Encounter: Payer: Self-pay | Admitting: *Deleted

## 2024-05-10 ENCOUNTER — Encounter
Admission: RE | Disposition: A | Discharge status: Home / Self Care | Payer: Self-pay | Source: Home / Self Care | Attending: Gastroenterology

## 2024-05-10 ENCOUNTER — Ambulatory Visit: Payer: Self-pay | Admitting: Anesthesiology

## 2024-05-10 ENCOUNTER — Ambulatory Visit
Admission: RE | Admit: 2024-05-10 | Discharge: 2024-05-10 | Disposition: A | Discharge status: Home / Self Care | Attending: Gastroenterology | Admitting: Gastroenterology

## 2024-05-10 DIAGNOSIS — Z7984 Long term (current) use of oral hypoglycemic drugs: Secondary | ICD-10-CM | POA: Insufficient documentation

## 2024-05-10 DIAGNOSIS — D120 Benign neoplasm of cecum: Secondary | ICD-10-CM | POA: Diagnosis not present

## 2024-05-10 DIAGNOSIS — I129 Hypertensive chronic kidney disease with stage 1 through stage 4 chronic kidney disease, or unspecified chronic kidney disease: Secondary | ICD-10-CM | POA: Insufficient documentation

## 2024-05-10 DIAGNOSIS — D128 Benign neoplasm of rectum: Secondary | ICD-10-CM | POA: Insufficient documentation

## 2024-05-10 DIAGNOSIS — Z9889 Other specified postprocedural states: Secondary | ICD-10-CM | POA: Diagnosis not present

## 2024-05-10 DIAGNOSIS — Z09 Encounter for follow-up examination after completed treatment for conditions other than malignant neoplasm: Secondary | ICD-10-CM | POA: Diagnosis present

## 2024-05-10 DIAGNOSIS — N184 Chronic kidney disease, stage 4 (severe): Secondary | ICD-10-CM | POA: Diagnosis not present

## 2024-05-10 DIAGNOSIS — Z79899 Other long term (current) drug therapy: Secondary | ICD-10-CM | POA: Insufficient documentation

## 2024-05-10 DIAGNOSIS — E1122 Type 2 diabetes mellitus with diabetic chronic kidney disease: Secondary | ICD-10-CM | POA: Diagnosis not present

## 2024-05-10 DIAGNOSIS — E119 Type 2 diabetes mellitus without complications: Secondary | ICD-10-CM | POA: Diagnosis not present

## 2024-05-10 DIAGNOSIS — Z85038 Personal history of other malignant neoplasm of large intestine: Secondary | ICD-10-CM | POA: Diagnosis not present

## 2024-05-10 DIAGNOSIS — D122 Benign neoplasm of ascending colon: Secondary | ICD-10-CM | POA: Diagnosis not present

## 2024-05-10 DIAGNOSIS — K219 Gastro-esophageal reflux disease without esophagitis: Secondary | ICD-10-CM | POA: Insufficient documentation

## 2024-05-10 HISTORY — DX: Personal history of adenomatous and serrated colon polyps: Z86.0101

## 2024-05-10 HISTORY — PX: POLYPECTOMY: SHX149

## 2024-05-10 HISTORY — PX: COLONOSCOPY: SHX5424

## 2024-05-10 LAB — GLUCOSE, CAPILLARY: Glucose-Capillary: 129 mg/dL — ABNORMAL HIGH (ref 70–99)

## 2024-05-10 SURGERY — COLONOSCOPY
Anesthesia: General

## 2024-05-10 MED ORDER — LIDOCAINE HCL (PF) 2 % IJ SOLN
INTRAMUSCULAR | Status: AC
Start: 2024-05-10 — End: 2024-05-10
  Filled 2024-05-10: qty 5

## 2024-05-10 MED ORDER — LIDOCAINE HCL (CARDIAC) PF 100 MG/5ML IV SOSY
PREFILLED_SYRINGE | INTRAVENOUS | Status: DC | PRN
  Administered 2024-05-10: 30 mg via INTRAVENOUS

## 2024-05-10 MED ORDER — SODIUM CHLORIDE 0.9 % IV SOLN
INTRAVENOUS | Status: DC
  Administered 2024-05-10: 20 mL/h via INTRAVENOUS

## 2024-05-10 MED ORDER — PROPOFOL 500 MG/50ML IV EMUL
INTRAVENOUS | Status: DC | PRN
  Administered 2024-05-10: 30 mg via INTRAVENOUS
  Administered 2024-05-10: 125 ug/kg/min via INTRAVENOUS

## 2024-05-10 MED ORDER — PROPOFOL 1000 MG/100ML IV EMUL
INTRAVENOUS | Status: AC
  Filled 2024-05-10: qty 100

## 2024-05-10 NOTE — Transfer of Care (Signed)
 Immediate Anesthesia Transfer of Care Note  Patient: Cassandra Parks  Procedure(s) Performed: COLONOSCOPY POLYPECTOMY, INTESTINE  Patient Location: PACU  Anesthesia Type:MAC  Level of Consciousness: awake and alert   Airway & Oxygen Therapy: Patient Spontanous Breathing  Post-op Assessment: Report given to RN and Post -op Vital signs reviewed and stable  Post vital signs: stable  Last Vitals:  Vitals Value Taken Time  BP 103/66 05/10/24 09:55  Temp    Pulse 75 05/10/24 09:55  Resp 16 05/10/24 09:55  SpO2 100 % 05/10/24 09:55  Vitals shown include unfiled device data.  Last Pain:  Vitals:   05/10/24 0844  TempSrc: Temporal  PainSc: 0-No pain         Complications: No notable events documented.

## 2024-05-10 NOTE — H&P (Signed)
 Outpatient short stay form Pre-procedure 05/10/2024  Cassandra ONEIDA Schick, MD  Primary Physician: Sharma Coyer, MD  Reason for visit:  Surveillance  History of present illness:    78 y/o lady with history of hypertension, CKD IV, and arthritis here for surveillance colonoscopy. No blood thinners. Daughter had colon cancer in her 74's. History of inguinal hernia repair.    Current Facility-Administered Medications:    0.9 %  sodium chloride  infusion, , Intravenous, Continuous, Cheresa Siers, Cassandra ONEIDA, MD, Last Rate: 20 mL/hr at 05/10/24 0915, Continued from Pre-op at 05/10/24 0915  Medications Prior to Admission  Medication Sig Dispense Refill Last Dose/Taking   famotidine  (PEPCID ) 20 MG tablet Take 1 tablet (20 mg total) by mouth 2 (two) times daily. 8 tablet 0 Past Week   FARXIGA 5 MG TABS tablet Take 5 mg by mouth every morning.   Past Week   HYDROcodone -acetaminophen  (NORCO/VICODIN) 5-325 MG tablet 1/2-1 tab every 4-6 hours as needed pain. 10 tablet 0 Past Week   losartan (COZAAR) 25 MG tablet Take 25 mg by mouth every morning.    Past Week   Na Sulfate-K Sulfate-Mg Sulf 17.5-3.13-1.6 GM/177ML SOLN Take 1 Bottle by mouth.   Past Week   triamterene-hydrochlorothiazide (MAXZIDE-25) 37.5-25 MG tablet Take 1 tablet by mouth daily.   Past Week   Vitamin D, Ergocalciferol, (DRISDOL) 1.25 MG (50000 UNIT) CAPS capsule Take 50,000 Units by mouth once a week.   Past Week     Allergies  Allergen Reactions   Shellfish Allergy    Sulfa Antibiotics Cough     Past Medical History:  Diagnosis Date   Arthritis    hands   Diabetes mellitus without complication (HCC)    Hx of adenomatous colonic polyps    Hypertension    Palsy, Bell's     Review of systems:  Otherwise negative.    Physical Exam  Gen: Alert, oriented. Appears stated age.  HEENT: PERRLA. Lungs: No respiratory distress CV: RRR Abd: soft, benign, no masses Ext: No edema    Planned procedures: Proceed with  colonoscopy. The patient understands the nature of the planned procedure, indications, risks, alternatives and potential complications including but not limited to bleeding, infection, perforation, damage to internal organs and possible oversedation/side effects from anesthesia. The patient agrees and gives consent to proceed.  Please refer to procedure notes for findings, recommendations and patient disposition/instructions.     Cassandra ONEIDA Schick, MD [facility]

## 2024-05-10 NOTE — Interval H&P Note (Signed)
 History and Physical Interval Note:  05/10/2024 9:25 AM  Cassandra Parks  has presented today for surgery, with the diagnosis of History of adenomatous polyp of colon (Z86.0101).  The various methods of treatment have been discussed with the patient and family. After consideration of risks, benefits and other options for treatment, the patient has consented to  Procedure(s) with comments: COLONOSCOPY (N/A) - DM as a surgical intervention.  The patient's history has been reviewed, patient examined, no change in status, stable for surgery.  I have reviewed the patient's chart and labs.  Questions were answered to the patient's satisfaction.     Cassandra Parks  Ok to proceed with colonoscopy

## 2024-05-10 NOTE — Anesthesia Preprocedure Evaluation (Signed)
 Anesthesia Evaluation  Patient identified by MRN, date of birth, ID band Patient awake    Reviewed: Allergy & Precautions, NPO status , Patient's Chart, lab work & pertinent test results  History of Anesthesia Complications Negative for: history of anesthetic complications  Airway Mallampati: II       Dental  (+) Dental Advidsory Given, Implants, Missing, Teeth Intact   Pulmonary neg shortness of breath, neg sleep apnea, neg COPD, neg recent URI, Not current smoker          Cardiovascular hypertension, Pt. on medications (-) angina (-) Past MI, (-) Cardiac Stents and (-) CHF (-) dysrhythmias (-) Valvular Problems/Murmurs     Neuro/Psych neg Seizures    GI/Hepatic Neg liver ROS,GERD  Medicated and Controlled,,  Endo/Other  diabetes, Type 2, Oral Hypoglycemic Agents    Renal/GU CRFRenal disease     Musculoskeletal   Abdominal   Peds  Hematology   Anesthesia Other Findings Past Medical History: No date: Arthritis     Comment:  hands No date: Diabetes mellitus without complication (HCC) No date: Hx of adenomatous colonic polyps No date: Hypertension No date: Palsy, Bell's   Reproductive/Obstetrics                              Anesthesia Physical Anesthesia Plan  ASA: 3  Anesthesia Plan: General   Post-op Pain Management:    Induction: Intravenous  PONV Risk Score and Plan: 3 and Treatment may vary due to age or medical condition, Propofol  infusion and TIVA  Airway Management Planned: Natural Airway and Nasal Cannula  Additional Equipment:   Intra-op Plan:   Post-operative Plan:   Informed Consent: I have reviewed the patients History and Physical, chart, labs and discussed the procedure including the risks, benefits and alternatives for the proposed anesthesia with the patient or authorized representative who has indicated his/her understanding and acceptance.       Plan  Discussed with:   Anesthesia Plan Comments:          Anesthesia Quick Evaluation

## 2024-05-10 NOTE — Op Note (Signed)
 [facility] Patient Name: Cassandra Parks Procedure Date: 05/10/2024 9:12 AM MRN: [id number] Account #: [id number] Date of Birth: 1945/10/22 Admit Type: Outpatient Age: 78 Room: [facility] ENDO ROOM 3 Gender: Female Note Status: Finalized Instrument Name: Colon Scope [id number] Procedure:             Colonoscopy Indications:           Surveillance: Personal history of adenomatous polyps                         on last colonoscopy > 3 years ago Providers:             Ole Schick MD, MD Referring MD:          Rockie Shed (Referring MD) Medicines:             Monitored Anesthesia Care Complications:         No immediate complications. Estimated blood loss:                         Minimal. Procedure:             Pre-Anesthesia Assessment:                        - Prior to the procedure, a History and Physical was                         performed, and patient medications and allergies were                         reviewed. The patient is competent. The risks and                         benefits of the procedure and the sedation options and                         risks were discussed with the patient. All questions                         were answered and informed consent was obtained.                         Patient identification and proposed procedure were                         verified by the physician, the nurse, the                         anesthesiologist, the anesthetist and the technician                         in the endoscopy suite. Mental Status Examination:                         alert and oriented. Airway Examination: normal                         oropharyngeal airway and neck mobility. Respiratory  Examination: clear to auscultation. CV Examination:                         normal. Prophylactic Antibiotics: The patient does not                         require prophylactic antibiotics. Prior                          Anticoagulants: The patient has taken no anticoagulant                         or antiplatelet agents. ASA Grade Assessment: III - A                         patient with severe systemic disease. After reviewing                         the risks and benefits, the patient was deemed in                         satisfactory condition to undergo the procedure. The                         anesthesia plan was to use monitored anesthesia care                         (MAC). Immediately prior to administration of                         medications, the patient was re-assessed for adequacy                         to receive sedatives. The heart rate, respiratory                         rate, oxygen saturations, blood pressure, adequacy of                         pulmonary ventilation, and response to care were                         monitored throughout the procedure. The physical                         status of the patient was re-assessed after the                         procedure.                        After obtaining informed consent, the colonoscope was                         passed under direct vision. Throughout the procedure,                         the patient's blood pressure, pulse, and oxygen  saturations were monitored continuously. The was                         introduced through the anus and advanced to the the                         cecum, identified by appendiceal orifice and ileocecal                         valve. The colonoscopy was performed without                         difficulty. The patient tolerated the procedure well.                         The quality of the bowel preparation was good. The                         ileocecal valve, appendiceal orifice, and rectum were                         photographed. Findings:      The perianal and digital rectal examinations were normal.      A 4 mm polyp was found in the cecum. The polyp was  sessile. The polyp       was removed with a cold snare. Resection and retrieval were complete.       Estimated blood loss was minimal.      A 3 mm polyp was found in the ascending colon. The polyp was sessile.       The polyp was removed with a cold snare. Resection and retrieval were       complete. Estimated blood loss was minimal.      A 2 mm polyp was found in the rectum. The polyp was sessile. The polyp       was removed with a cold snare. Resection and retrieval were complete.       Estimated blood loss was minimal.      The exam was otherwise without abnormality on direct and retroflexion       views. Impression:            - One 4 mm polyp in the cecum, removed with a cold                         snare. Resected and retrieved.                        - One 3 mm polyp in the ascending colon, removed with                         a cold snare. Resected and retrieved.                        - One 2 mm polyp in the rectum, removed with a cold                         snare. Resected and retrieved.                        -  The examination was otherwise normal on direct and                         retroflexion views. Recommendation:        - Discharge patient to home.                        - Resume previous diet.                        - Continue present medications.                        - Await pathology results.                        - Repeat colonoscopy is not recommended due to current                         age (43 years or older) for surveillance.                        - Return to referring physician as previously                         scheduled. Procedure Code(s):     --- Professional ---                        [phone], Colonoscopy, flexible; with removal of                         tumor(s), polyp(s), or other lesion(s) by snare                         technique Diagnosis Code(s):     --- Professional ---                        Z86.010, Personal history of colonic polyps                         D12.0, Benign neoplasm of cecum                        D12.2, Benign neoplasm of ascending colon                        D12.8, Benign neoplasm of rectum CPT copyright 2022 [employer]. All rights reserved. The codes documented in this report are preliminary and upon coder review may  be revised to meet current compliance requirements. Ole Schick MD, MD 05/10/2024 9:55:32 AM Number of Addenda: 0 Note Initiated On: 05/10/2024 9:12 AM Scope Withdrawal Time: 0 hours 10 minutes 53 seconds  Total Procedure Duration: 0 hours 17 minutes 47 seconds  Estimated Blood Loss:  Estimated blood loss was minimal.      [facility]

## 2024-05-12 NOTE — Anesthesia Postprocedure Evaluation (Signed)
 Anesthesia Post Note  Patient: Cassandra Parks  Procedure(s) Performed: COLONOSCOPY POLYPECTOMY, INTESTINE  Patient location during evaluation: Endoscopy Anesthesia Type: General Level of consciousness: awake and alert Pain management: pain level controlled Vital Signs Assessment: post-procedure vital signs reviewed and stable Respiratory status: spontaneous breathing, nonlabored ventilation, respiratory function stable and patient connected to nasal cannula oxygen Cardiovascular status: blood pressure returned to baseline and stable Postop Assessment: no apparent nausea or vomiting Anesthetic complications: no   No notable events documented.   Last Vitals:  Vitals:   05/10/24 1005 05/10/24 1015  BP: 117/71 132/77  Pulse: 77 69  Resp: 14 13  Temp: (!) 36.1 C (!) 36.1 C  SpO2: 100% 100%    Last Pain:  Vitals:   05/10/24 1015  TempSrc:   PainSc: 0-No pain                 Prentice Murphy

## 2024-05-13 LAB — SURGICAL PATHOLOGY

## 2024-05-27 ENCOUNTER — Encounter: Payer: Self-pay | Admitting: Family Medicine

## 2024-05-27 ENCOUNTER — Ambulatory Visit: Admitting: Family Medicine

## 2024-05-27 VITALS — BP 125/74 | HR 74 | Ht 61.0 in | Wt 125.0 lb

## 2024-05-27 DIAGNOSIS — E559 Vitamin D deficiency, unspecified: Secondary | ICD-10-CM | POA: Diagnosis not present

## 2024-05-27 DIAGNOSIS — Z1159 Encounter for screening for other viral diseases: Secondary | ICD-10-CM | POA: Diagnosis not present

## 2024-05-27 DIAGNOSIS — E1159 Type 2 diabetes mellitus with other circulatory complications: Secondary | ICD-10-CM

## 2024-05-27 DIAGNOSIS — E1122 Type 2 diabetes mellitus with diabetic chronic kidney disease: Secondary | ICD-10-CM | POA: Diagnosis not present

## 2024-05-27 DIAGNOSIS — N184 Chronic kidney disease, stage 4 (severe): Secondary | ICD-10-CM | POA: Diagnosis not present

## 2024-05-27 DIAGNOSIS — Z794 Long term (current) use of insulin: Secondary | ICD-10-CM

## 2024-05-27 DIAGNOSIS — Z8669 Personal history of other diseases of the nervous system and sense organs: Secondary | ICD-10-CM

## 2024-05-27 DIAGNOSIS — H6123 Impacted cerumen, bilateral: Secondary | ICD-10-CM

## 2024-05-27 DIAGNOSIS — I152 Hypertension secondary to endocrine disorders: Secondary | ICD-10-CM

## 2024-05-27 MED ORDER — INSULIN GLARGINE 100 UNIT/ML ~~LOC~~ SOLN: 10.0000 [IU] | Freq: Every evening | SUBCUTANEOUS | 3 refills | Status: DC

## 2024-05-27 MED ORDER — INSULIN PEN NEEDLE 31G X 5 MM MISC: 1.0000 | Freq: Every evening | 3 refills | Status: DC

## 2024-05-27 NOTE — Progress Notes (Signed)
 New Patient Office Visit  Introduced to nurse practitioner role and practice setting.  All questions answered.  Discussed provider/patient relationship and expectations.   Subjective    Patient ID: Cassandra Parks, female    DOB: April 09, 1946  Age: 78 y.o. MRN: [id number]  CC:  Chief Complaint  Patient presents with   Establish Care   HPI  Discussed the use of AI scribe software for clinical note transcription with the patient, who gave verbal consent to proceed. History of Present Illness Cassandra Parks is a 78 year old female with diabetes and stage 4 kidney disease who presents for a routine follow-up visit.  She has a history of diabetes, currently managed with Farxiga 5mg  and Lantus  insulin . She takes 10 units of Lantus  nightly and monitors her blood sugar levels, which typically range from 80 to 130 mg/dL fasting. Her last A1c was 8.1% almost a year ago.  She has stage 4 kidney disease and is under the care of Dr. Tina for nephrology. She was previously considered for a kidney transplant, but the organ was not suitable. She is not on dialysis and has a follow-up appointment in October.  She has a history of arthritis in her hands and knees, which she describes as bursitis in her knees.  She experienced Bell's palsy approximately 20 years ago and still has some residual effects. No sensation changes but notes some movement issues in her face.  She takes a weekly vitamin D  supplement and red yeast rice. No alcohol, substance, or tobacco use.  She is involved in church activities and enjoys eating and shopping. She is married, has two living children, and six grandchildren. Her daughter passed away from colon cancer four years ago at the age of 44.   Outpatient Encounter Medications as of 05/27/2024  Medication Sig   FARXIGA 5 MG TABS tablet Take 10 mg by mouth every morning.   Insulin  Pen Needle 31G X 5 MM MISC 1 Needle by Does not apply route at bedtime.   losartan  (COZAAR) 25 MG tablet Take 50 mg by mouth every morning.   Red Yeast Rice Extract (RED YEAST RICE PO) Take by mouth.   triamterene-hydrochlorothiazide (MAXZIDE-25) 37.5-25 MG tablet Take 1 tablet by mouth daily.   Vitamin D , Ergocalciferol , (DRISDOL) 1.25 MG (50000 UNIT) CAPS capsule Take 50,000 Units by mouth once a week.   [DISCONTINUED] insulin  glargine (LANTUS ) 100 UNIT/ML injection Inject 10 Units into the skin at bedtime.   famotidine  (PEPCID ) 20 MG tablet Take 1 tablet (20 mg total) by mouth 2 (two) times daily. (Patient not taking: Reported on 05/27/2024)   insulin  glargine (LANTUS ) 100 UNIT/ML injection Inject 0.1 mLs (10 Units total) into the skin at bedtime.   [DISCONTINUED] HYDROcodone -acetaminophen  (NORCO/VICODIN) 5-325 MG tablet 1/2-1 tab every 4-6 hours as needed pain.   [DISCONTINUED] Na Sulfate-K Sulfate-Mg Sulf 17.5-3.13-1.6 GM/177ML SOLN Take 1 Bottle by mouth.   No facility-administered encounter medications on file as of 05/27/2024.    Past Medical History:  Diagnosis Date   Arthritis    hands   Diabetes mellitus without complication (HCC)    Hx of adenomatous colonic polyps    Hypertension    Palsy, Bell's     Past Surgical History:  Procedure Laterality Date   BREAST BIOPSY Left    negative   BUNIONECTOMY Bilateral    COLONOSCOPY N/A 05/10/2024   Procedure: COLONOSCOPY;  Surgeon: Maryruth Ole DASEN, MD;  Location: [facility] ENDOSCOPY;  Service: Endoscopy;  Laterality: N/A;  DM   COLONOSCOPY WITH PROPOFOL  N/A 06/01/2020   Procedure: COLONOSCOPY WITH PROPOFOL ;  Surgeon: Maryruth Ole DASEN, MD;  Location: [facility] ENDOSCOPY;  Service: Endoscopy;  Laterality: N/A;   ESOPHAGOGASTRODUODENOSCOPY (EGD) WITH PROPOFOL  N/A 06/01/2020   Procedure: ESOPHAGOGASTRODUODENOSCOPY (EGD) WITH PROPOFOL ;  Surgeon: Maryruth Ole DASEN, MD;  Location: [facility] ENDOSCOPY;  Service: Endoscopy;  Laterality: N/A;   FINGER SURGERY Left    HERNIA REPAIR     INGUINAL HERNIA REPAIR Right 07/24/2020    Procedure: HERNIA REPAIR INGUINAL ADULT;  Surgeon: Dessa Reyes ORN, MD;  Location: [facility] ORS;  Service: General;  Laterality: Right;   POLYPECTOMY  05/10/2024   Procedure: POLYPECTOMY, INTESTINE;  Surgeon: Maryruth Ole DASEN, MD;  Location: [facility] ENDOSCOPY;  Service: Endoscopy;;    Family History  Problem Relation Age of Onset   Hyperlipidemia Mother    Breast cancer Paternal Aunt    Colon cancer Daughter 52    Social History   Socioeconomic History   Marital status: Married    Spouse name: Not on file   Number of children: Not on file   Years of education: Not on file   Highest education level: Not on file  Occupational History   Not on file  Tobacco Use   Smoking status: Never   Smokeless tobacco: Never  Vaping Use   Vaping status: Never Used  Substance and Sexual Activity   Alcohol use: No   Drug use: Never   Sexual activity: Not Currently  Other Topics Concern   Not on file  Social History Narrative   Not on file   Social Drivers of Health   Financial Resource Strain: Not on file  Food Insecurity: No Food Insecurity (05/27/2024)   Hunger Vital Sign    Worried About Running Out of Food in the Last Year: Never true    Ran Out of Food in the Last Year: Never true  Transportation Needs: No Transportation Needs (05/27/2024)   PRAPARE - Administrator, Civil Service (Medical): No    Lack of Transportation (Non-Medical): No  Physical Activity: Not on file  Stress: Not on file  Social Connections: Socially Integrated (05/27/2024)   Social Connection and Isolation Panel    Frequency of Communication with Friends and Family: More than three times a week    Frequency of Social Gatherings with Friends and Family: More than three times a week    Attends Religious Services: More than 4 times per year    Active Member of [employer] or Organizations: Yes    Attends Engineer, structural: More than 4 times per year    Marital Status: Married  Catering manager  Violence: Not At Risk (05/27/2024)   Humiliation, Afraid, Rape, and Kick questionnaire    Fear of Current or Ex-Partner: No    Emotionally Abused: No    Physically Abused: No    Sexually Abused: No    ROS      Objective    BP 125/74 (BP Location: Right Arm, Patient Position: Sitting, Cuff Size: Normal)   Pulse 74   Ht 5' 1 (1.549 m)   Wt 125 lb (56.7 kg)   SpO2 99%   BMI 23.62 kg/m   Physical Exam Constitutional:      General: She is not in acute distress.    Appearance: Normal appearance. She is normal weight. She is not toxic-appearing or diaphoretic.  HENT:     Head: Normocephalic.     Ears:     Comments: Excessive  cerumen bilateral ears, dry in consistency    Nose: Nose normal.     Mouth/Throat:     Mouth: Mucous membranes are moist.     Pharynx: Oropharynx is clear.  Eyes:     Extraocular Movements: Extraocular movements intact.     Conjunctiva/sclera: Conjunctivae normal.     Pupils: Pupils are equal, round, and reactive to light.  Neck:     Vascular: No carotid bruit.  Cardiovascular:     Rate and Rhythm: Normal rate and regular rhythm.     Pulses: Normal pulses.     Heart sounds: Normal heart sounds. No murmur heard.    No friction rub. No gallop.  Pulmonary:     Effort: No respiratory distress.     Breath sounds: No stridor. No wheezing, rhonchi or rales.  Chest:     Chest wall: No tenderness.  Abdominal:     Tenderness: There is no right CVA tenderness or left CVA tenderness.  Musculoskeletal:        General: No swelling.     Right lower leg: No edema.     Left lower leg: No edema.  Lymphadenopathy:     Cervical: No cervical adenopathy.  Skin:    General: Skin is warm and dry.     Capillary Refill: Capillary refill takes less than 2 seconds.  Neurological:     General: No focal deficit present.     Mental Status: She is alert and oriented to person, place, and time. Mental status is at baseline.     Cranial Nerves: Cranial nerve deficit  present.     Sensory: No sensory deficit.     Motor: No weakness.     Coordination: Coordination normal.     Gait: Gait normal.     Comments: L facial weakness, baseline for Bells Palsy 20 years ago  Psychiatric:        Mood and Affect: Mood normal.        Behavior: Behavior normal.        Thought Content: Thought content normal.        Judgment: Judgment normal.         Assessment & Plan:  Assessment and Plan Assessment & Plan Type 2 diabetes mellitus Type 2 diabetes mellitus managed with Farxiga and Lantus . Blood glucose levels typically range from 80 to 130 mg/dL fasting. Last A1c was 8.1% almost a year ago. - Order A1c test, CMP, Urine ACR, Lipid - Continue Farxiga 10 mg daily - Continue Lantus  10 units nightly - on arb, UTD with eye exam, sees podiatry.   Chronic kidney disease stage 4 Chronic kidney disease stage 4, managed with nephrology. Previously considered for kidney transplant but the organ was not suitable. She remains on the transplant list. - Not on dialysis - ON Losartan, Farxiga 10mg  daily - Continue nephrology follow-up in October - Monitor kidney function with labs - Ensure adequate water intake daily - Will check Urine ACR and CMP  Hypertension - Continue losartan 50mg  - Triamterne/hydrochlorothiazide 37.5/25 - Farxiga 10mg   Osteoarthritis - Knees and hands - Continue as needed tylenol  - symptomatic mgmt, no concerns today  Excessive Cerimen, Bilateral Bilateral cerumen causing itching and potential hearing difficulty.  Eardrums not visible due to dry wax. - Recommend Debrox ear drops once or twice a week to loosen wax  Hx of Bells Palsy - Residual L sided facial weakness, non bothersome to patient, L eyelid able to close.   CVD Prevention - has never been on  statin per chart review - Based on labs, and DMII - woul dmost likely recommend being on statin - Takes Red Yeast Rice.    Type 2 diabetes mellitus with stage 4 chronic kidney  disease, with long-term current use of insulin  (HCC) -     Lipid panel -     Hemoglobin A1c -     Comprehensive metabolic panel with GFR -     CBC -     Microalbumin / creatinine urine ratio -     Insulin  Glargine; Inject 0.1 mLs (10 Units total) into the skin at bedtime.  Dispense: 10 mL; Refill: 3 -     Insulin  Pen Needle; 1 Needle by Does not apply route at bedtime.  Dispense: 90 each; Refill: 3  Screening for viral disease -     Hepatitis C antibody -     HIV Antibody (routine testing w rflx)  Vitamin D  deficiency -     VITAMIN D  25 Hydroxy (Vit-D Deficiency, Fractures)  History of Bell's palsy  Hypertension associated with diabetes (HCC)  Chronic kidney disease, stage 4 (severe) (HCC)  Excessive cerumen in both ear canals    Return in about 3 months (around 08/27/2024) for dmII & htn F/U.   Introduced to Publishing rights manager role and practice setting.  All questions answered.  Discussed provider/patient relationship and expectations.   Curtis DELENA Boom, FNP

## 2024-05-28 ENCOUNTER — Other Ambulatory Visit: Payer: Self-pay | Admitting: Family Medicine

## 2024-05-28 DIAGNOSIS — E1122 Type 2 diabetes mellitus with diabetic chronic kidney disease: Secondary | ICD-10-CM

## 2024-05-28 MED ORDER — INSULIN PEN NEEDLE 32G X 4 MM MISC: 1.0000 | Freq: Every evening | 1 refills | Status: AC

## 2024-05-29 ENCOUNTER — Ambulatory Visit: Payer: Self-pay | Admitting: Family Medicine

## 2024-05-29 LAB — COMPREHENSIVE METABOLIC PANEL WITH GFR
ALT: 9 IU/L (ref 0–32)
AST: 17 IU/L (ref 0–40)
Albumin: 3.9 g/dL (ref 3.8–4.8)
Alkaline Phosphatase: 70 IU/L (ref 44–121)
BUN/Creatinine Ratio: 17 (ref 12–28)
BUN: 37 mg/dL — ABNORMAL HIGH (ref 8–27)
Bilirubin Total: 0.4 mg/dL (ref 0.0–1.2)
CO2: 20 mmol/L (ref 20–29)
Calcium: 9.3 mg/dL (ref 8.7–10.3)
Chloride: 106 mmol/L (ref 96–106)
Creatinine, Ser: 2.21 mg/dL — ABNORMAL HIGH (ref 0.57–1.00)
Globulin, Total: 2.9 g/dL (ref 1.5–4.5)
Glucose: 124 mg/dL — ABNORMAL HIGH (ref 70–99)
Potassium: 4.2 mmol/L (ref 3.5–5.2)
Sodium: 145 mmol/L — ABNORMAL HIGH (ref 134–144)
Total Protein: 6.8 g/dL (ref 6.0–8.5)
eGFR: 22 mL/min/1.73 — ABNORMAL LOW (ref >59)

## 2024-05-29 LAB — LIPID PANEL
Chol/HDL Ratio: 3.5 ratio (ref 0.0–4.4)
Cholesterol, Total: 212 mg/dL — ABNORMAL HIGH (ref 100–199)
HDL: 60 mg/dL (ref >39)
LDL Chol Calc (NIH): 135 mg/dL — ABNORMAL HIGH (ref 0–99)
Triglycerides: 97 mg/dL (ref 0–149)
VLDL Cholesterol Cal: 17 mg/dL (ref 5–40)

## 2024-05-29 LAB — VITAMIN D 25 HYDROXY (VIT D DEFICIENCY, FRACTURES): Vit D, 25-Hydroxy: 35.7 ng/mL (ref 30.0–100.0)

## 2024-05-29 LAB — CBC
Hematocrit: 40.4 % (ref 34.0–46.6)
Hemoglobin: 12.6 g/dL (ref 11.1–15.9)
MCH: 29.8 pg (ref 26.6–33.0)
MCHC: 31.2 g/dL — ABNORMAL LOW (ref 31.5–35.7)
MCV: 96 fL (ref 79–97)
Platelets: 163 x10E3/uL (ref 150–450)
RBC: 4.23 x10E6/uL (ref 3.77–5.28)
RDW: 13.8 % (ref 11.7–15.4)
WBC: 5.2 x10E3/uL (ref 3.4–10.8)

## 2024-05-29 LAB — HEMOGLOBIN A1C
Est. average glucose Bld gHb Est-mCnc: 177 mg/dL
Hgb A1c MFr Bld: 7.8 % — ABNORMAL HIGH (ref 4.8–5.6)

## 2024-05-29 LAB — MICROALBUMIN / CREATININE URINE RATIO
Creatinine, Urine: 80.3 mg/dL
Microalb/Creat Ratio: 13 mg/g{creat} (ref 0–29)
Microalbumin, Urine: 10.4 ug/mL

## 2024-05-29 LAB — HIV ANTIBODY (ROUTINE TESTING W REFLEX): HIV Screen 4th Generation wRfx: NONREACTIVE

## 2024-05-29 LAB — HEPATITIS C ANTIBODY: Hep C Virus Ab: NONREACTIVE

## 2024-05-30 ENCOUNTER — Other Ambulatory Visit: Payer: Self-pay | Admitting: Family Medicine

## 2024-05-30 DIAGNOSIS — N184 Chronic kidney disease, stage 4 (severe): Secondary | ICD-10-CM

## 2024-05-30 MED ORDER — LANTUS SOLOSTAR 100 UNIT/ML ~~LOC~~ SOPN: 12.0000 [IU] | PEN_INJECTOR | Freq: Every day | SUBCUTANEOUS | 3 refills | Status: DC

## 2024-05-30 NOTE — Progress Notes (Signed)
 Please call patient with results, no MyChart.   Elevated A1C, Recommend increasing lantus  to 12units per night with a goal of <130 blood glucose fasting. Will change order  Increased lipids and LDL bad fat, risk for cardiac event is 41%. It is recommended to being taking a cholesterol lowering agent like a statin. Atorvastatin 40 mg if agreeable.   CKD Stage 4 present on metabolic panel - continue to intake adequate water.  The 10-year ASCVD risk score (Arnett DK, et al., 2019) is: 41%   Values used to calculate the score:     Age: 41 years     Clincally relevant sex: Female     Is Non-Hispanic African American: Yes     Diabetic: Yes     Tobacco smoker: No     Systolic Blood Pressure: 125 mmHg     Is BP treated: Yes     HDL Cholesterol: 60 mg/dL     Total Cholesterol: 212 mg/dL

## 2024-06-06 ENCOUNTER — Encounter: Payer: Self-pay | Admitting: Family Medicine

## 2024-06-06 ENCOUNTER — Ambulatory Visit: Admitting: Family Medicine

## 2024-06-06 VITALS — BP 120/76 | HR 76 | Wt 126.0 lb

## 2024-06-06 DIAGNOSIS — Z23 Encounter for immunization: Secondary | ICD-10-CM | POA: Diagnosis not present

## 2024-06-06 DIAGNOSIS — H906 Mixed conductive and sensorineural hearing loss, bilateral: Secondary | ICD-10-CM

## 2024-06-06 DIAGNOSIS — E1122 Type 2 diabetes mellitus with diabetic chronic kidney disease: Secondary | ICD-10-CM

## 2024-06-06 DIAGNOSIS — N184 Chronic kidney disease, stage 4 (severe): Secondary | ICD-10-CM

## 2024-06-06 DIAGNOSIS — E1169 Type 2 diabetes mellitus with other specified complication: Secondary | ICD-10-CM

## 2024-06-06 DIAGNOSIS — Z794 Long term (current) use of insulin: Secondary | ICD-10-CM | POA: Diagnosis not present

## 2024-06-06 DIAGNOSIS — E559 Vitamin D deficiency, unspecified: Secondary | ICD-10-CM | POA: Diagnosis not present

## 2024-06-06 DIAGNOSIS — G510 Bell's palsy: Secondary | ICD-10-CM

## 2024-06-06 DIAGNOSIS — N189 Chronic kidney disease, unspecified: Secondary | ICD-10-CM

## 2024-06-06 DIAGNOSIS — E785 Hyperlipidemia, unspecified: Secondary | ICD-10-CM

## 2024-06-06 MED ORDER — ROSUVASTATIN CALCIUM 5 MG PO TABS: 5.0000 mg | ORAL_TABLET | Freq: Every day | ORAL | 3 refills | Status: AC

## 2024-06-06 MED ORDER — LANTUS SOLOSTAR 100 UNIT/ML ~~LOC~~ SOPN: PEN_INJECTOR | SUBCUTANEOUS | Status: AC

## 2024-06-06 NOTE — Progress Notes (Signed)
 Established patient visit   Patient: Cassandra Parks   DOB: March 08, 1946   78 y.o. Female  MRN: [id number] Visit Date: 06/06/2024  Today's healthcare provider: Rockie Agent, MD   Chief Complaint  Patient presents with   Follow-up    Patient here to discuss lab results   Subjective     HPI     Follow-up    Additional comments: Patient here to discuss lab results      Last edited by Rosas, Joseline E, CMA on 06/06/2024 11:09 AM.       Discussed the use of AI scribe software for clinical note transcription with the patient, who gave verbal consent to proceed.  History of Present Illness Cassandra Parks is a 78 year old female with type 2 diabetes, chronic kidney disease stage 4, and hypertension who presents for follow-up on lab work and medication management.  She recently had labs completed with her previous primary care provider and is transitioning care to a new provider. She is on the kidney transplant list and is followed by a nephrologist.  Her type 2 diabetes is managed with Lantus . However, she currently takes 10 units at bedtime and only administers it if her blood sugar is over 100. Fasting blood sugars sometimes drop as low as 60. She previously used metformin, which was discontinued due to her kidney function.  She has chronic kidney disease stage 4 with a creatinine level of 2.21 and an estimated glomerular filtration rate (eGFR) of 22. No issues with urination are reported. Her mother also had kidney problems.  She has hyperlipidemia with an LDL of 135 mg/dL, with a goal of less than 55 mg/dL. She was previously taking red yeast rice.  Her chronic hypertension is managed with losartan 25 mg daily and triamterene-hydrochlorothiazide 37.5-25 mg daily. Her blood pressure was recently recorded at 120/76 mmHg.  She has a history of Bell's palsy affecting the left side of her face and has had cataract surgery. She also has a history of polyps and  recently underwent a colonoscopy.  She reports hearing difficulties and had a hearing study done about a year ago, which suggested the need for hearing aids. She has not seen an ear, nose, and throat specialist recently.     Past Medical History:  Diagnosis Date   Arthritis    hands   Diabetes mellitus without complication (HCC)    Hx of adenomatous colonic polyps    Hypertension    Palsy, Bell's     Medications: Outpatient Medications Prior to Visit  Medication Sig   FARXIGA 10 MG TABS tablet Take 10 mg by mouth every morning.   Insulin  Pen Needle 32G X 4 MM MISC 1 Needle by Does not apply route at bedtime.   losartan (COZAAR) 25 MG tablet Take 50 mg by mouth every morning.   Red Yeast Rice Extract (RED YEAST RICE PO) Take by mouth.   triamterene-hydrochlorothiazide (MAXZIDE-25) 37.5-25 MG tablet Take 1 tablet by mouth daily.   Vitamin D , Ergocalciferol , (DRISDOL) 1.25 MG (50000 UNIT) CAPS capsule Take 50,000 Units by mouth once a week.   [DISCONTINUED] insulin  glargine (LANTUS  SOLOSTAR) 100 UNIT/ML Solostar Pen Inject 12 Units into the skin at bedtime.   [DISCONTINUED] famotidine  (PEPCID ) 20 MG tablet Take 1 tablet (20 mg total) by mouth 2 (two) times daily. (Patient not taking: Reported on 05/27/2024)   [DISCONTINUED] FARXIGA 5 MG TABS tablet Take 10 mg by mouth every morning.   No facility-administered  medications prior to visit.    Review of Systems  Last CBC Lab Results  Component Value Date   WBC 5.2 05/28/2024   HGB 12.6 05/28/2024   HCT 40.4 05/28/2024   MCV 96 05/28/2024   MCH 29.8 05/28/2024   RDW 13.8 05/28/2024   PLT 163 05/28/2024   Last metabolic panel Lab Results  Component Value Date   GLUCOSE 124 (H) 05/28/2024   NA 145 (H) 05/28/2024   K 4.2 05/28/2024   CL 106 05/28/2024   CO2 20 05/28/2024   BUN 37 (H) 05/28/2024   CREATININE 2.21 (H) 05/28/2024   EGFR 22 (L) 05/28/2024   CALCIUM  9.3 05/28/2024   PHOS 3.5 05/10/2021   PROT 6.8 05/28/2024    ALBUMIN 3.9 05/28/2024   LABGLOB 2.9 05/28/2024   BILITOT 0.4 05/28/2024   ALKPHOS 70 05/28/2024   AST 17 05/28/2024   ALT 9 05/28/2024   ANIONGAP 5 05/10/2021   Last lipids Lab Results  Component Value Date   CHOL 212 (H) 05/28/2024   HDL 60 05/28/2024   LDLCALC 135 (H) 05/28/2024   TRIG 97 05/28/2024   CHOLHDL 3.5 05/28/2024   Last hemoglobin A1c Lab Results  Component Value Date   HGBA1C 7.8 (H) 05/28/2024    Last vitamin D  Lab Results  Component Value Date   25OHVITD2 4.7 05/10/2021   25OHVITD3 53 05/10/2021   VD25OH 35.7 05/28/2024         Objective    BP 120/76 (BP Location: Right Arm, Patient Position: Sitting, Cuff Size: Normal)   Pulse 76   Wt 126 lb (57.2 kg)   SpO2 100%   BMI 23.81 kg/m  BP Readings from Last 3 Encounters:  06/06/24 120/76  05/27/24 125/74  05/10/24 132/77   Wt Readings from Last 3 Encounters:  06/06/24 126 lb (57.2 kg)  05/27/24 125 lb (56.7 kg)  05/10/24 125 lb 3.2 oz (56.8 kg)        Physical Exam  Physical Exam VITALS: BP- 120/76 HEENT: Ears normal CHEST: Clear to auscultation bilaterally NEUROLOGICAL: Cranial nerves grossly intact, decreased facial muscle movement on left side, sensation symmetric and intact   No results found for any visits on 06/06/24.   Assessment & Plan     Problem List Items Addressed This Visit     Hyperlipidemia associated with type 2 diabetes mellitus (HCC)   Relevant Medications   FARXIGA 10 MG TABS tablet   rosuvastatin  (CRESTOR ) 5 MG tablet   insulin  glargine (LANTUS  SOLOSTAR) 100 UNIT/ML Solostar Pen   Mixed conductive and sensorineural hearing loss of both ears   Palsy, Bell's   Type 2 diabetes mellitus with stage 4 chronic kidney disease, with long-term current use of insulin  (HCC) - Primary   Relevant Medications   FARXIGA 10 MG TABS tablet   rosuvastatin  (CRESTOR ) 5 MG tablet   insulin  glargine (LANTUS  SOLOSTAR) 100 UNIT/ML Solostar Pen   Vitamin D  deficiency due to  chronic kidney disease   Other Visit Diagnoses       Immunization due       Relevant Orders   Flu vaccine HIGH DOSE PF(Fluzone Trivalent) (Completed)        Assessment & Plan Chronic kidney disease stage 4 due to type 2 diabetes mellitus Chronic kidney disease stage 4 with elevated creatinine at 2.21 and eGFR at 22, indicating significant renal impairment. The condition is associated with type 2 diabetes mellitus. She is under the care of a nephrologist and is on the transplant  list for a kidney. - Continue follow-up with nephrologist - Maintain on Farxiga 10 mg daily for kidney protection - Monitor kidney function regularly  Type 2 diabetes mellitus Type 2 diabetes mellitus with an A1c of 7.8, indicating suboptimal control. The goal is to maintain A1c under 8 due to age and kidney function considerations. Blood sugar management is crucial to prevent further kidney damage. Metformin was discontinued due to low eGFR. Lantus  adjustment is based on blood sugar levels to avoid hypoglycemia. - Adjust Lantus  to 10 units daily, only if fasting blood sugar is over 120 - Monitor blood sugar twice daily: fasting in the morning and two hours after dinner - Continue losartan 25 mg daily for kidney protection - Schedule diabetes eye exam - Schedule diabetes foot exam  Hypertension Chronic hypertension with current blood pressure well-controlled at 120/76. She is on losartan and triamterene-hydrochlorothiazide for management. - Continue losartan 25 mg daily - Continue triamterene-hydrochlorothiazide 37.5-25 mg daily  Hyperlipidemia Hyperlipidemia with LDL at 135, above the target of less than 55 due to diabetes history. Previous use of red yeast rice was insufficient to control LDL levels. Crestor  is recommended to lower LDL, with monitoring for potential increase in blood sugar levels. - Start Crestor  40 mg daily - Monitor lipid levels regularly  Bilateral mixed conductive and sensorineural  hearing loss Bilateral mixed conductive and sensorineural hearing loss with previous hearing evaluation over a year ago. She reports difficulty hearing and past recommendation for hearing aids. - Refer to ENT for updated hearing evaluation and management  General Health Maintenance Routine health maintenance is up to date with negative hepatitis C and HIV screenings, normal CBC, and vitamin D  within goal range. She requires updates on certain vaccinations. - Administered flu shot today - Recommend updated tetanus booster - Recommend pneumococcal vaccine - Recommend Shingrix vaccine     Return in about 3 months (around 09/05/2024).       Rockie Agent, MD  [facility] [phone] (phone) [phone] (fax)  [facility]

## 2024-06-06 NOTE — Patient Instructions (Signed)
 Health Maintenance  Topic Date Due   Medicare Annual Wellness (AWV)  Never done   FOOT EXAM  Never done   OPHTHALMOLOGY EXAM  Never done   DTaP/Tdap/Td (1 - Tdap) Never done   Pneumococcal Vaccine: 50+ Years (1 of 2 - PCV) Never done   Zoster Vaccines- Shingrix (1 of 2) Never done   INFLUENZA VACCINE  Never done   COVID-19 Vaccine (1 - 2024-25 season) Never done   HEMOGLOBIN A1C  11/28/2024   Diabetic kidney evaluation - eGFR measurement  05/28/2025   Diabetic kidney evaluation - Urine ACR  05/28/2025   DEXA SCAN  Completed   Hepatitis C Screening  Completed   HPV VACCINES  Aged Out   Meningococcal B Vaccine  Aged Out   Colonoscopy  Discontinued

## 2024-06-14 ENCOUNTER — Ambulatory Visit: Admitting: Podiatry

## 2024-06-14 ENCOUNTER — Encounter: Payer: Self-pay | Admitting: Podiatry

## 2024-06-14 DIAGNOSIS — E119 Type 2 diabetes mellitus without complications: Secondary | ICD-10-CM | POA: Diagnosis not present

## 2024-06-14 DIAGNOSIS — L84 Corns and callosities: Secondary | ICD-10-CM

## 2024-06-14 DIAGNOSIS — B351 Tinea unguium: Secondary | ICD-10-CM

## 2024-06-14 DIAGNOSIS — Z794 Long term (current) use of insulin: Secondary | ICD-10-CM | POA: Diagnosis not present

## 2024-06-14 DIAGNOSIS — M2042 Other hammer toe(s) (acquired), left foot: Secondary | ICD-10-CM

## 2024-06-14 DIAGNOSIS — M2041 Other hammer toe(s) (acquired), right foot: Secondary | ICD-10-CM | POA: Diagnosis not present

## 2024-06-14 DIAGNOSIS — M79675 Pain in left toe(s): Secondary | ICD-10-CM | POA: Diagnosis not present

## 2024-06-14 DIAGNOSIS — E1165 Type 2 diabetes mellitus with hyperglycemia: Secondary | ICD-10-CM

## 2024-06-14 DIAGNOSIS — M79674 Pain in right toe(s): Secondary | ICD-10-CM | POA: Diagnosis not present

## 2024-06-17 NOTE — Progress Notes (Signed)
 Subjective:  Patient ID: Cassandra Parks, female    DOB: Nov 27, 1945,  MRN: [id number]  Cassandra Parks presents to clinic today for for annual diabetic foot examination and painful elongated mycotic toenails 1-5 bilaterally which are tender when wearing enclosed shoe gear. Pain is relieved with periodic professional debridement.  Chief Complaint  Patient presents with   [facility]    Rm4 Diabetic foot care    New problem(s): None.   PCP is Simmons-Robinson, Makiera, MD.  Allergies  Allergen Reactions   Penicillins Swelling   Shellfish Allergy    Sulfa Antibiotics Cough    Review of Systems: Negative except as noted in the HPI.  Objective: No changes noted in today's physical examination. There were no vitals filed for this visit. Cassandra Parks is a pleasant 79 y.o. female WD, WN in NAD. AAO x 3.   Diabetic foot exam was performed with the following findings:   Normal sensation of 10g monofilament Intact posterior tibialis and dorsalis pedis pulses Vascular Examination: Capillary refill time immediate b/l. Palpable pedal pulses. Pedal hair present b/l. Pedal edema absent. No pain with calf compression b/l. Skin temperature gradient WNL b/l. No cyanosis or clubbing b/l. No ischemia or gangrene noted b/l.   Neurological Examination: Sensation grossly intact b/l with 10 gram monofilament. Vibratory sensation intact b/l.   Dermatological Examination: Pedal skin with normal turgor, texture and tone b/l.  No open wounds. No interdigital macerations.   Toenails 1-5 b/l thick, discolored, elongated with subungual debris and pain on dorsal palpation.   Incurvated nailplate both borders of left hallux and both borders of right hallux with tenderness to palpation. No erythema, no edema, no drainage noted. No fluctuance.   Hyperkeratotic lesion(s) dorsal PIPJ of L 5th toe and submet head 5 right foot.  No erythema, no edema, no drainage, no fluctuance.  Musculoskeletal  Examination: Muscle strength 5/5 to all lower extremity muscle groups bilaterally. No pain, crepitus or joint limitation noted with ROM bilateral LE. Hammertoe(s) b/l 5th toes.  Radiographs: None  Assessment/Plan: 1. Pain due to onychomycosis of toenails of both feet   2. Corns and callosities   3. Acquired hammertoes of both feet   4. Type 2 diabetes mellitus with hyperglycemia, with long-term current use of insulin  (HCC)   5. Encounter for diabetic foot exam [facility])   Patient was evaluated and treated. All patient's and/or POA's questions/concerns addressed on today's visit. No invasive procedure(s) performed. Offending nail border debrided and curretaged both borders of left hallux and both borders of right hallux utilizing sterile nail nipper and currette. Border cleansed with alcohol and triple antibiotic ointment applied. She may see Dr. Tobie for further treatment of ingrown toenails. Call office if there are any concerns. Toenails 2-5 b/l debrided in length and girth without incident. Corn(s) and Callus(es) dorsal PIPJ of L 5th toe and submet head 5 right foot pared with sharp debridement without incident. Continue soft, supportive shoe gear daily. Report any pedal injuries to medical professional. Call office if there are any questions/concerns.  Return in about 3 months (around 09/13/2024).  Cassandra Parks, DPM      [place] LOCATION: [address] [place], [place] [address]  Office [phone]   [facility] LOCATION: [address] [place], [place] [address] Office [phone]

## 2024-07-01 ENCOUNTER — Encounter: Payer: Self-pay | Admitting: Lab

## 2024-07-01 ENCOUNTER — Ambulatory Visit

## 2024-07-01 DIAGNOSIS — Z794 Long term (current) use of insulin: Secondary | ICD-10-CM | POA: Diagnosis not present

## 2024-07-01 DIAGNOSIS — M79675 Pain in left toe(s): Secondary | ICD-10-CM

## 2024-07-01 DIAGNOSIS — E1165 Type 2 diabetes mellitus with hyperglycemia: Secondary | ICD-10-CM

## 2024-07-01 DIAGNOSIS — L600 Ingrowing nail: Secondary | ICD-10-CM

## 2024-07-01 DIAGNOSIS — L84 Corns and callosities: Secondary | ICD-10-CM | POA: Diagnosis not present

## 2024-07-01 DIAGNOSIS — B351 Tinea unguium: Secondary | ICD-10-CM

## 2024-07-01 DIAGNOSIS — M79674 Pain in right toe(s): Secondary | ICD-10-CM

## 2024-07-01 NOTE — Patient Instructions (Signed)

## 2024-07-01 NOTE — Progress Notes (Signed)
 Subjective:  Patient ID: Cassandra Parks, female    DOB: [birth year],  MRN: [id number]  Chief Complaint  Patient presents with   Ingrown Toenail    She states she has pain on both borders but the medial hurts worst that the lateral    78 y.o. female presents with the above complaint. She has had pain to right hallux bilateral borders for a few weeks. She denies any drainage, wound, or sign of infection. The area has had minor redness. She also complains of a callus to her left 5th digit.    Review of Systems: Negative except as noted in the HPI. Denies N/V/F/Ch.  Past Medical History:  Diagnosis Date   Arthritis    hands   Diabetes mellitus without complication (HCC)    Hx of adenomatous colonic polyps    Hypertension    Palsy, Bell's     Current Outpatient Medications:    FARXIGA 10 MG TABS tablet, Take 10 mg by mouth every morning., Disp: , Rfl:    insulin  glargine (LANTUS  SOLOSTAR) 100 UNIT/ML Solostar Pen, Only take if glucose is greater than 120, Disp: , Rfl:    Insulin  Pen Needle 32G X 4 MM MISC, 1 Needle by Does not apply route at bedtime., Disp: 100 each, Rfl: 1   losartan (COZAAR) 25 MG tablet, Take 50 mg by mouth every morning., Disp: , Rfl:    Red Yeast Rice Extract (RED YEAST RICE PO), Take by mouth., Disp: , Rfl:    rosuvastatin  (CRESTOR ) 5 MG tablet, Take 1 tablet (5 mg total) by mouth daily., Disp: 90 tablet, Rfl: 3   triamterene-hydrochlorothiazide (MAXZIDE-25) 37.5-25 MG tablet, Take 1 tablet by mouth daily., Disp: , Rfl:    Vitamin D , Ergocalciferol , (DRISDOL) 1.25 MG (50000 UNIT) CAPS capsule, Take 50,000 Units by mouth once a week., Disp: , Rfl:   Social History   Tobacco Use  Smoking Status Never  Smokeless Tobacco Never    Allergies  Allergen Reactions   Penicillins Swelling   Shellfish Allergy    Sulfa Antibiotics Cough   Objective:  There were no vitals filed for this visit. There is no height or weight on file to calculate  BMI. Constitutional Well developed. Well nourished.  Vascular Dorsalis pedis pulses palpable bilaterally. Posterior tibial pulses palpable bilaterally. Capillary refill normal to all digits.  No cyanosis or clubbing noted. Pedal hair growth normal.  Neurologic Normal speech. Oriented to person, place, and time. Epicritic sensation to light touch grossly present bilaterally.  Dermatologic Painful ingrowing nail at bilateral nail borders of the hallux nail right. No other open wounds. Hyperkeratotic lesion without central core to plantar lateral aspect of left 5th digit. No wound or sign of infection.   Orthopedic: Normal joint ROM without pain or crepitus bilaterally. No visible deformities. No bony tenderness.   Radiographs: None Assessment:   1. Ingrown nail   2. Pain due to onychomycosis of toenails of both feet   3. Type 2 diabetes mellitus with hyperglycemia, with long-term current use of insulin  [facility])    Plan:  Patient was evaluated and treated and all questions answered.  Ingrown Nail, right -Discussed treatment options ranging from surgical to conservative along with risks and benefits of each. Patient expresses understanding. Patient elects to proceed with minor surgery to remove ingrown toenail removal today. Consent reviewed and signed by patient. -Ingrown nail excised. See procedure note. -Educated on post-procedure care including soaking. Written instructions provided and reviewed. -Patient to follow up in 2 weeks  for nail check.  Corns and callosities, right - Debrided hyperkeratotic lesion left 5th digit without incident. Patient expressed satisfaction.   Procedure: Excision of Ingrown Toenail Location: Right 1st toe bilateral nail borders. Anesthesia: Lidocaine  1% plain; 1.5 mL and Marcaine  0.5% plain; 1.5 mL, digital block. Skin Prep: Betadine. Dressing: Silvadene; telfa; dry, sterile, compression dressing. Technique: Following skin prep, the toe was  exsanguinated and a tourniquet was secured at the base of the toe. The affected nail border was freed, split with a nail splitter, and excised. Chemical matrixectomy was then performed with phenol and irrigated out with alcohol. The tourniquet was then removed and sterile dressing applied. Disposition: Patient tolerated procedure well. Patient to return in 2 weeks for follow-up.  Post op care: Keep dressing clean, dry, and intact for 24 hours before removing. Soak operative foot and redress BID as printed instructions describe. Patient educated on signs of infection, pt expresses understanding and will proceed for prompt medical care if signs of infection arise.   Return in about 2 weeks (around 07/15/2024).  Prentice Ovens, DPM AACFAS Fellowship Trained Podiatric Surgeon [facility]

## 2024-07-02 ENCOUNTER — Ambulatory Visit: Admitting: Podiatry

## 2024-07-16 ENCOUNTER — Ambulatory Visit: Admitting: Podiatry

## 2024-07-16 DIAGNOSIS — L600 Ingrowing nail: Secondary | ICD-10-CM | POA: Diagnosis not present

## 2024-07-16 NOTE — Progress Notes (Signed)
 Subjective: Cassandra Parks is a 78 y.o.  female returns to office today for follow up evaluation after having right Hallux Medial, Lateral border nail avulsion performed. Patient has been soaking using epsom salt and applying topical antibiotic covered with bandaid daily. Patient denies fevers, chills, nausea, vomiting. Denies any calf pain, chest pain, SOB.   Objective:  Vitals: Reviewed  General: Well developed, nourished, in no acute distress, alert and oriented x3   Dermatology: Skin is warm, dry and supple bilateral. Medial, Lateral hallux nail border appears to be clean, dry, with mild granular tissue and surrounding scab. There is no surrounding erythema, edema, drainage/purulence. The remaining nails appear unremarkable at this time. There are no other lesions or other signs of infection present.  Neurovascular status: Intact. No lower extremity swelling; No pain with calf compression bilateral.  Musculoskeletal: Decreased tenderness to palpation of the Medial, Lateral hallux nail fold(s). Muscular strength within normal limits bilateral.   Assesement and Plan: S/p partial nail avulsion, doing well.   -Discontinue soaking in epsom salts twice a day followed by antibiotic ointment and a band-aid as needed. Can leave uncovered at night. .  -If the area does not heal properply, call the office for follow-up appointment, or sooner if any problems arise.  -Monitor for any signs/symptoms of infection. Call the office immediately if any occur or go directly to the emergency room. Call with any questions/concerns.  Amberlynn Tempesta, DPM

## 2024-07-31 ENCOUNTER — Ambulatory Visit (INDEPENDENT_AMBULATORY_CARE_PROVIDER_SITE_OTHER): Admitting: Emergency Medicine

## 2024-07-31 VITALS — Ht 61.0 in | Wt 126.0 lb

## 2024-07-31 DIAGNOSIS — Z0001 Encounter for general adult medical examination with abnormal findings: Secondary | ICD-10-CM

## 2024-07-31 DIAGNOSIS — Z780 Asymptomatic menopausal state: Secondary | ICD-10-CM

## 2024-07-31 DIAGNOSIS — Z794 Long term (current) use of insulin: Secondary | ICD-10-CM | POA: Diagnosis not present

## 2024-07-31 DIAGNOSIS — Z0000 Encounter for general adult medical examination without abnormal findings: Secondary | ICD-10-CM

## 2024-07-31 DIAGNOSIS — Z1231 Encounter for screening mammogram for malignant neoplasm of breast: Secondary | ICD-10-CM

## 2024-07-31 NOTE — Patient Instructions (Signed)
 Cassandra Parks,  Thank you for taking the time for your Medicare Wellness Visit. I appreciate your continued commitment to your health goals. Please review the care plan we discussed, and feel free to reach out if I can assist you further.  Medicare recommends these wellness visits once per year to help you and your care team stay ahead of potential health issues. These visits are designed to focus on prevention, allowing your provider to concentrate on managing your acute and chronic conditions during your regular appointments.  Please note that Annual Wellness Visits do not include a physical exam. Some assessments may be limited, especially if the visit was conducted virtually. If needed, we may recommend a separate in-person follow-up with your provider.  Ongoing Care Seeing your primary care provider every 3 to 6 months helps us  monitor your health and provide consistent, personalized care.   Referrals If a referral was made during today's visit and you haven't received any updates within two weeks, please contact the referred provider directly to check on the status.  I have placed a referral to Diabetic & Nutrition Education at [facility]. Someone should call you. Their ph# is [phone].  Recommended Screenings:  Please call to schedule your mammogram (due after 08/20/24) and bone density scan:  [facility] Address: [address], [place], [place] Phone: [phone]  [facility] [address] [place], [place] [address] Phone: [phone]     Health Maintenance  Topic Date Due   DTaP/Tdap/Td vaccine (1 - Tdap) Never done   Pneumococcal Vaccine for age over 5 (1 of 2 - PCV) Never done   Zoster (Shingles) Vaccine (1 of 2) Never done   DEXA scan (bone density measurement)  04/30/2018   COVID-19 Vaccine (1 - 2025-26 season) Never done   Breast Cancer Screening  08/20/2024   Hemoglobin A1C  11/28/2024    Eye exam for diabetics  03/19/2025   Yearly kidney function blood test for diabetes  05/28/2025   Yearly kidney health urinalysis for diabetes  05/28/2025   Complete foot exam   06/14/2025   Medicare Annual Wellness Visit  07/31/2025   Flu Shot  Completed   Hepatitis C Screening  Completed   Meningitis B Vaccine  Aged Out   Colon Cancer Screening  Discontinued       07/31/2024   10:18 AM  Advanced Directives  Does Patient Have a Medical Advance Directive? Yes  Type of Estate Agent of [place];Living will  Does patient want to make changes to medical advance directive? No - Patient declined  Copy of Healthcare Power of Attorney in Chart? No - copy requested   Advance Care Planning is important because it: Ensures you receive medical care that aligns with your values, goals, and preferences. Provides guidance to your family and loved ones, reducing the emotional burden of decision-making during critical moments.  Vision: Annual vision screenings are recommended for early detection of glaucoma, cataracts, and diabetic retinopathy. These exams can also reveal signs of chronic conditions such as diabetes and high blood pressure.  Dental: Annual dental screenings help detect early signs of oral cancer, gum disease, and other conditions linked to overall health, including heart disease and diabetes.  Please see the attached documents for additional preventive care recommendations.    Fall Prevention in the Home, Adult Falls can cause injuries and affect people of all ages. There are many simple things that you can do to  make your home safe and to help prevent falls. If you need it, ask for help making these changes. What actions can I take to prevent falls? General information Use good lighting in all rooms. Make sure to: Replace any light bulbs that burn out. Turn on lights if it is dark and use night-lights. Keep items that you use often in easy-to-reach  places. Lower the shelves around your home if needed. Move furniture so that there are clear paths around it. Do not keep throw rugs or other things on the floor that can make you trip. If any of your floors are uneven, fix them. Add color or contrast paint or tape to clearly mark and help you see: Grab bars or handrails. First and last steps of staircases. Where the edge of each step is. If you use a ladder or stepladder: Make sure that it is fully opened. Do not climb a closed ladder. Make sure the sides of the ladder are locked in place. Have someone hold the ladder while you use it. Know where your pets are as you move through your home. What can I do in the bathroom?     Keep the floor dry. Clean up any water that is on the floor right away. Remove soap buildup in the bathtub or shower. Buildup makes bathtubs and showers slippery. Use non-skid mats or decals on the floor of the bathtub or shower. Attach bath mats securely with double-sided, non-slip rug tape. If you need to sit down while you are in the shower, use a non-slip stool. Install grab bars by the toilet and in the bathtub and shower. Do not use towel bars as grab bars. What can I do in the bedroom? Make sure that you have a light by your bed that is easy to reach. Do not use any sheets or blankets on your bed that hang to the floor. Have a firm bench or chair with side arms that you can use for support when you get dressed. What can I do in the kitchen? Clean up any spills right away. If you need to reach something above you, use a sturdy step stool that has a grab bar. Keep electrical cables out of the way. Do not use floor polish or wax that makes floors slippery. What can I do with my stairs? Do not leave anything on the stairs. Make sure that you have a light switch at the top and the bottom of the stairs. Have them installed if you do not have them. Make sure that there are handrails on both sides of the  stairs. Fix handrails that are broken or loose. Make sure that handrails are as long as the staircases. Install non-slip stair treads on all stairs in your home if they do not have carpet. Avoid having throw rugs at the top or bottom of stairs, or secure the rugs with carpet tape to prevent them from moving. Choose a carpet design that does not hide the edge of steps on the stairs. Make sure that carpet is firmly attached to the stairs. Fix any carpet that is loose or worn. What can I do on the outside of my home? Use bright outdoor lighting. Repair the edges of walkways and driveways and fix any cracks. Clear paths of anything that can make you trip, such as tools or rocks. Add color or contrast paint or tape to clearly mark and help you see high doorway thresholds. Trim any bushes or trees on the main path  into your home. Check that handrails are securely fastened and in good repair. Both sides of all steps should have handrails. Install guardrails along the edges of any raised decks or porches. Have leaves, snow, and ice cleared regularly. Use sand, salt, or ice melt on walkways during winter months if you live where there is ice and snow. In the garage, clean up any spills right away, including grease or oil spills. What other actions can I take? Review your medicines with your health care provider. Some medicines can make you confused or feel dizzy. This can increase your chance of falling. Wear closed-toe shoes that fit well and support your feet. Wear shoes that have rubber soles and low heels. Use a cane, walker, scooter, or crutches that help you move around if needed. Talk with your provider about other ways that you can decrease your risk of falls. This may include seeing a physical therapist to learn to do exercises to improve movement and strength. Where to find more information Centers for Disease Control and Prevention, [employer]: tonerpromos.no [employer] on Aging:  baseringtones.pl National Institute on Aging: baseringtones.pl Contact a health care provider if: You are afraid of falling at home. You feel weak, drowsy, or dizzy at home. You fall at home. Get help right away if you: Lose consciousness or have trouble moving after a fall. Have a fall that causes a head injury. These symptoms may be an emergency. Get help right away. Call 911. Do not wait to see if the symptoms will go away. Do not drive yourself to the hospital. This information is not intended to replace advice given to you by your health care provider. Make sure you discuss any questions you have with your health care provider. Document Revised: 05/23/2022 Document Reviewed: 05/23/2022 Elsevier Patient Education  2024 [employer].

## 2024-07-31 NOTE — Progress Notes (Signed)
 Subjective:   Cassandra Parks is a 78 y.o. who presents for a Medicare Wellness preventive visit.  As a reminder, Annual Wellness Visits don't include a physical exam, and some assessments may be limited, especially if this visit is performed virtually. We may recommend an in-person follow-up visit with your provider if needed.  Visit Complete: Virtual I connected with  Cassandra Parks on 07/31/24 by a audio enabled telemedicine application and verified that I am speaking with the correct person using two identifiers.  Patient Location: Home  Provider Location: Home Office  I discussed the limitations of evaluation and management by telemedicine. The patient expressed understanding and agreed to proceed.  Vital Signs: Because this visit was a virtual/telehealth visit, some criteria may be missing or patient reported. Any vitals not documented were not able to be obtained and vitals that have been documented are patient reported.  VideoDeclined- This patient declined Librarian, academic. Therefore the visit was completed with audio only.  Persons Participating in Visit: Patient.  AWV Questionnaire: No: Patient Medicare AWV questionnaire was not completed prior to this visit.  Cardiac Risk Factors include: advanced age (>15men, >25 women);diabetes mellitus;dyslipidemia     Objective:    Today's Vitals   07/31/24 1005  Weight: 126 lb (57.2 kg)  Height: 5' 1 (1.549 m)   Body mass index is 23.81 kg/m.     07/31/2024   10:18 AM 05/10/2024    8:43 AM 07/16/2020    4:47 PM 06/01/2020   10:17 AM 07/12/2016   12:26 AM  Advanced Directives  Does Patient Have a Medical Advance Directive? Yes Yes No No No   Type of Estate Agent of [place];Living will Healthcare Power of [place];Living will     Does patient want to make changes to medical advance directive? No - Patient declined      Copy of Healthcare Power of Attorney in Chart? No  - copy requested      Would patient like information on creating a medical advance directive?    No - Patient declined No - patient declined information      Data saved with a previous flowsheet row definition    Current Medications (verified) Outpatient Encounter Medications as of 07/31/2024  Medication Sig   Cholecalciferol (VITAMIN D3) 1000 units CAPS Take 1 tablet by mouth daily.   FARXIGA 10 MG TABS tablet Take 10 mg by mouth every morning.   insulin  glargine (LANTUS  SOLOSTAR) 100 UNIT/ML Solostar Pen Only take if glucose is greater than 120   Insulin  Pen Needle 32G X 4 MM MISC 1 Needle by Does not apply route at bedtime.   losartan (COZAAR) 50 MG tablet Take 50 mg by mouth daily.   rosuvastatin  (CRESTOR ) 5 MG tablet Take 1 tablet (5 mg total) by mouth daily.   triamterene-hydrochlorothiazide (MAXZIDE-25) 37.5-25 MG tablet Take 1 tablet by mouth daily.   Red Yeast Rice Extract (RED YEAST RICE PO) Take by mouth. (Patient not taking: Reported on 07/31/2024)   Vitamin D , Ergocalciferol , (DRISDOL) 1.25 MG (50000 UNIT) CAPS capsule Take 50,000 Units by mouth once a week. (Patient not taking: Reported on 07/31/2024)   [DISCONTINUED] losartan (COZAAR) 25 MG tablet Take 50 mg by mouth every morning.   No facility-administered encounter medications on file as of 07/31/2024.    Allergies (verified) Penicillins, Shellfish allergy, and Sulfa antibiotics   History: Past Medical History:  Diagnosis Date   Arthritis    hands   Diabetes mellitus  without complication (HCC)    Hx of adenomatous colonic polyps    Hypertension    Palsy, Bell's    Past Surgical History:  Procedure Laterality Date   BREAST BIOPSY Left    negative   BUNIONECTOMY Bilateral    COLONOSCOPY N/A 05/10/2024   Procedure: COLONOSCOPY;  Surgeon: Maryruth Ole DASEN, MD;  Location: [facility];  Service: Endoscopy;  Laterality: N/A;  DM   COLONOSCOPY WITH PROPOFOL  N/A 06/01/2020   Procedure: COLONOSCOPY WITH  PROPOFOL ;  Surgeon: Maryruth Ole DASEN, MD;  Location: [facility];  Service: Endoscopy;  Laterality: N/A;   ESOPHAGOGASTRODUODENOSCOPY (EGD) WITH PROPOFOL  N/A 06/01/2020   Procedure: ESOPHAGOGASTRODUODENOSCOPY (EGD) WITH PROPOFOL ;  Surgeon: Maryruth Ole DASEN, MD;  Location: [facility];  Service: Endoscopy;  Laterality: N/A;   FINGER SURGERY Left    HERNIA REPAIR     INGUINAL HERNIA REPAIR Right 07/24/2020   Procedure: HERNIA REPAIR INGUINAL ADULT;  Surgeon: Dessa Reyes ORN, MD;  Location: [facility] OR[facility];  Service: General;  Laterality: Right;   POLYPECTOMY  05/10/2024   Procedure: POLYPECTOMY, INTESTINE;  Surgeon: Maryruth Ole DASEN, MD;  Location: [facility];  Service: Endoscopy;;   Family History  Problem Relation Age of Onset   Hyperlipidemia Mother    Breast cancer Paternal Aunt    Colon cancer Daughter 65   Social History   Socioeconomic History   Marital status: Married    Spouse name: Seena   Number of children: 3   Years of education: Not on file   Highest education level: Not on file  Occupational History   Occupation: retired  Tobacco Use   Smoking status: Never   Smokeless tobacco: Never  Vaping Use   Vaping status: Never Used  Substance and Sexual Activity   Alcohol use: No   Drug use: Never   Sexual activity: Not Currently  Other Topics Concern   Not on file  Social History Narrative   07/31/24 2 children living and 1 deceased/pbt   Social Drivers of Health   Financial Resource Strain: Low Risk  (07/31/2024)   Overall Financial Resource Strain (CARDIA)    Difficulty of Paying Living Expenses: Not very hard  Food Insecurity: No Food Insecurity (07/31/2024)   Hunger Vital Sign    Worried About Running Out of Food in the Last Year: Never true    Ran Out of Food in the Last Year: Never true  Transportation Needs: No Transportation Needs (07/31/2024)   PRAPARE - Administrator, Civil Service (Medical): No    Lack of Transportation  (Non-Medical): No  Physical Activity: Insufficiently Active (07/31/2024)   Exercise Vital Sign    Days of Exercise per Week: 2 days    Minutes of Exercise per Session: 10 min  Stress: No Stress Concern Present (07/31/2024)   [employer] of Occupational Health - Occupational Stress Questionnaire    Feeling of Stress: Not at all  Social Connections: Socially Integrated (07/31/2024)   Social Connection and Isolation Panel    Frequency of Communication with Friends and Family: More than three times a week    Frequency of Social Gatherings with Friends and Family: More than three times a week    Attends Religious Services: More than 4 times per year    Active Member of [employer] or Organizations: Yes    Attends Engineer, Structural: More than 4 times per year    Marital Status: Married    Tobacco Counseling Counseling given: Not Answered    Clinical Intake:  Pre-visit preparation completed: Yes  Pain : No/denies pain     BMI - recorded: 23.81 Nutritional Status: BMI of 19-24  Normal Nutritional Risks: None Diabetes: Yes CBG done?: No Did pt. bring in CBG monitor from home?: No  Lab Results  Component Value Date   HGBA1C 7.8 (H) 05/28/2024   HGBA1C 7.6 (H) 05/10/2021     How often do you need to have someone help you when you read instructions, pamphlets, or other written materials from your doctor or pharmacy?: 1 - Never  Interpreter Needed?: No  Information entered by :: Vina Ned, CMA   Activities of Daily Living     07/31/2024   10:08 AM 05/27/2024    8:55 AM  In your present state of health, do you have any difficulty performing the following activities:  Hearing? 1 1  Comment sometimes   Vision? 0 0  Difficulty concentrating or making decisions? 0 0  Walking or climbing stairs? 0 0  Dressing or bathing? 0 0  Doing errands, shopping? 0 0  Preparing Food and eating ? N   Using the Toilet? N   In the past six months, have you accidently  leaked urine? N   Do you have problems with loss of bowel control? N   Managing your Medications? N   Managing your Finances? N   Housekeeping or managing your Housekeeping? N     Patient Care Team: Sharma Coyer, MD as PCP - General (Family Medicine) Gaynel Delon CROME, DPM as Consulting Physician (Podiatry) Tina Damien DEL, MD as Referring Physician (Nephrology) Mevelyn JONETTA Bathe, OD (Optometry) Maryruth, Ole DASEN, MD as Consulting Physician (Gastroenterology)  I have updated your Care Teams any recent Medical Services you may have received from other providers in the past year.     Assessment:   This is a routine wellness examination for Cassandra Parks.  Hearing/Vision screen Hearing Screening - Comments:: Denies hearing loss  Vision Screening - Comments:: UTD @ Dr. Bathe Mevelyn [facility] [place]   Goals Addressed               This Visit's Progress     eat healthier and do more exercise (pt-stated)         Depression Screen     07/31/2024   10:16 AM 05/27/2024    8:55 AM  PHQ 2/9 Scores  PHQ - 2 Score 0 0  PHQ- 9 Score 0 0    Fall Risk     07/31/2024   10:19 AM 06/06/2024   11:53 AM 05/27/2024    8:55 AM  Fall Risk   Falls in the past year? 0 0 0  Number falls in past yr: 0 0 0  Injury with Fall? 0 0 0  Risk for fall due to : No Fall Risks    Follow up Falls evaluation completed      MEDICARE RISK AT HOME:  Medicare Risk at Home Any stairs in or around the home?: Yes If so, are there any without handrails?: No Home free of loose throw rugs in walkways, pet beds, electrical cords, etc?: Yes Adequate lighting in your home to reduce risk of falls?: Yes Life alert?: No Use of a cane, walker or w/c?: No Grab bars in the bathroom?: Yes Shower chair or bench in shower?: No Elevated toilet seat or a handicapped toilet?: Yes  TIMED UP AND GO:  Was the test performed?  No  Cognitive Function: 6CIT completed        07/31/2024  10:21 AM  6CIT Screen   What Year? 0 points  What month? 0 points  What time? 0 points  Count back from 20 0 points  Months in reverse 0 points  Repeat phrase 0 points  Total Score 0 points    Immunizations Immunization History  Administered Date(s) Administered   INFLUENZA, HIGH DOSE SEASONAL PF 06/06/2024    Screening Tests Health Maintenance  Topic Date Due   DTaP/Tdap/Td (1 - Tdap) Never done   Pneumococcal Vaccine: 50+ Years (1 of 2 - PCV) Never done   Zoster Vaccines- Shingrix (1 of 2) Never done   DEXA SCAN  04/30/2018   COVID-19 Vaccine (1 - 2025-26 season) Never done   Mammogram  08/20/2024   HEMOGLOBIN A1C  11/28/2024   OPHTHALMOLOGY EXAM  03/19/2025   Diabetic kidney evaluation - eGFR measurement  05/28/2025   Diabetic kidney evaluation - Urine ACR  05/28/2025   FOOT EXAM  06/14/2025   Medicare Annual Wellness (AWV)  07/31/2025   Influenza Vaccine  Completed   Hepatitis C Screening  Completed   Meningococcal B Vaccine  Aged Out   Colonoscopy  Discontinued    Health Maintenance Items Addressed: Mammogram ordered, DEXA ordered, See Nurse Notes at the end of this note, referral placed for DM & Nutrition Education  Additional Screening:  Vision Screening: Recommended annual ophthalmology exams for early detection of glaucoma and other disorders of the eye. Is the patient up to date with their annual eye exam?  Yes  Who is the provider or what is the name of the office in which the patient attends annual eye exams? Dr. Robynn Mevelyn [facility] [place]  Dental Screening: Recommended annual dental exams for proper oral hygiene  Community Resource Referral / Chronic Care Management: CRR required this visit?  No   CCM required this visit?  No   Plan:    I have personally reviewed and noted the following in the patient's chart:   Medical and social history Use of alcohol, tobacco or illicit drugs  Current medications and supplements including opioid prescriptions. Patient is not  currently taking opioid prescriptions. Functional ability and status Nutritional status Physical activity Advanced directives List of other physicians Hospitalizations, surgeries, and ER visits in previous 12 months Vitals Screenings to include cognitive, depression, and falls Referrals and appointments  In addition, I have reviewed and discussed with patient certain preventive protocols, quality metrics, and best practice recommendations. A written personalized care plan for preventive services as well as general preventive health recommendations were provided to patient.   Vina Ned, CMA   07/31/2024   After Visit Summary: (Mail) Due to this being a telephonic visit, the after visit summary with patients personalized plan was offered to patient via mail   Notes:  Placed order for MMG (due ~ 08/20/24) Placed order for DEXA scan Placed referral to DM & Nutrition education per patient's request Received covid vaccine 06/2024 at [facility] Declined Tdap, pneumonia and Shingles vaccines

## 2024-08-05 ENCOUNTER — Other Ambulatory Visit: Payer: Self-pay | Admitting: Family Medicine

## 2024-08-05 NOTE — Telephone Encounter (Unsigned)
 Copied from CRM [id number]. Topic: Clinical - Medication Refill >> Aug 05, 2024 10:02 AM China J wrote: Medication: triamterene-hydrochlorothiazide (MAXZIDE-25) 37.5-25 MG tablet  Has the patient contacted their pharmacy? Yes (Agent: If no, request that the patient contact the pharmacy for the refill. If patient does not wish to contact the pharmacy document the reason why and proceed with request.) (Agent: If yes, when and what did the pharmacy advise?)  This is the patient's preferred pharmacy:  [id number] [place], [place] - [address] [place] [address] Phone: [phone] Fax: [phone]  Is this the correct pharmacy for this prescription? Yes If no, delete pharmacy and type the correct one.   Has the prescription been filled recently? No  Is the patient out of the medication? Yes  Has the patient been seen for an appointment in the last year OR does the patient have an upcoming appointment? Yes  Can we respond through MyChart? No  Agent: Please be advised that Rx refills may take up to 3 business days. We ask that you follow-up with your pharmacy.

## 2024-08-06 MED ORDER — TRIAMTERENE-HCTZ 37.5-25 MG PO TABS: 1.0000 | ORAL_TABLET | Freq: Every day | ORAL | 1 refills | Status: DC

## 2024-08-06 NOTE — Telephone Encounter (Signed)
 Requested medications are due for refill today.  yes  Requested medications are on the active medications list.  yes  Last refill. 05/29/2020   Future visit scheduled.   yes  Notes to clinic.  Medication is historical.    Requested Prescriptions  Pending Prescriptions Disp Refills   triamterene-hydrochlorothiazide (MAXZIDE-25) 37.5-25 MG tablet      Sig: Take 1 tablet by mouth daily.     Cardiovascular: Diuretic Combos Failed - 08/06/2024  3:52 PM      Failed - Na in normal range and within 180 days    Sodium  Date Value Ref Range Status  05/28/2024 145 (H) 134 - 144 mmol/L Final         Failed - Cr in normal range and within 180 days    Creatinine, Ser  Date Value Ref Range Status  05/28/2024 2.21 (H) 0.57 - 1.00 mg/dL Final         Passed - K in normal range and within 180 days    Potassium  Date Value Ref Range Status  05/28/2024 4.2 3.5 - 5.2 mmol/L Final         Passed - Last BP in normal range    BP Readings from Last 1 Encounters:  06/06/24 120/76         Passed - Valid encounter within last 6 months    Recent Outpatient Visits           2 months ago Type 2 diabetes mellitus with stage 4 chronic kidney disease, with long-term current use of insulin  (HCC)   [facility] Simmons-Robinson, [place], MD   2 months ago Type 2 diabetes mellitus with stage 4 chronic kidney disease, with long-term current use of insulin  [facility])   [facility] [place], [place] [facility], [place]

## 2024-09-03 ENCOUNTER — Ambulatory Visit: Admitting: Family Medicine

## 2024-09-03 ENCOUNTER — Encounter: Payer: Self-pay | Admitting: Family Medicine

## 2024-09-03 VITALS — BP 132/80 | HR 72 | Ht 61.0 in | Wt 127.7 lb

## 2024-09-03 DIAGNOSIS — N184 Chronic kidney disease, stage 4 (severe): Secondary | ICD-10-CM

## 2024-09-03 DIAGNOSIS — E1169 Type 2 diabetes mellitus with other specified complication: Secondary | ICD-10-CM

## 2024-09-03 DIAGNOSIS — G510 Bell's palsy: Secondary | ICD-10-CM

## 2024-09-03 DIAGNOSIS — Z794 Long term (current) use of insulin: Secondary | ICD-10-CM

## 2024-09-03 DIAGNOSIS — E1122 Type 2 diabetes mellitus with diabetic chronic kidney disease: Secondary | ICD-10-CM

## 2024-09-03 DIAGNOSIS — N189 Chronic kidney disease, unspecified: Secondary | ICD-10-CM

## 2024-09-03 DIAGNOSIS — Z13820 Encounter for screening for osteoporosis: Secondary | ICD-10-CM

## 2024-09-03 DIAGNOSIS — I152 Hypertension secondary to endocrine disorders: Secondary | ICD-10-CM

## 2024-09-03 DIAGNOSIS — E1159 Type 2 diabetes mellitus with other circulatory complications: Secondary | ICD-10-CM | POA: Insufficient documentation

## 2024-09-03 DIAGNOSIS — E559 Vitamin D deficiency, unspecified: Secondary | ICD-10-CM

## 2024-09-03 DIAGNOSIS — H906 Mixed conductive and sensorineural hearing loss, bilateral: Secondary | ICD-10-CM

## 2024-09-03 DIAGNOSIS — E785 Hyperlipidemia, unspecified: Secondary | ICD-10-CM

## 2024-09-03 MED ORDER — TRIAMTERENE-HCTZ 37.5-25 MG PO TABS: 0.5000 | ORAL_TABLET | Freq: Every day | ORAL | Status: AC

## 2024-09-03 MED ORDER — TRULICITY 0.75 MG/0.5ML ~~LOC~~ SOAJ: 0.7500 mg | SUBCUTANEOUS | 1 refills | Status: DC

## 2024-09-03 MED ORDER — FARXIGA 10 MG PO TABS: 10.0000 mg | ORAL_TABLET | Freq: Every day | ORAL | 1 refills | Status: AC

## 2024-09-03 NOTE — Assessment & Plan Note (Signed)
 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 - Continue cholecalciferol 2000 units daily.

## 2024-09-03 NOTE — Progress Notes (Signed)
 Established patient visit   Patient: Cassandra Parks   DOB: 1946-03-20   78 y.o. Female  MRN: [id number] Visit Date: 09/03/2024  Today's healthcare provider: Rockie Agent, MD   Chief Complaint  Patient presents with   Medical Management of Chronic Issues    Patient is present for chronic f/u, doing well   Subjective     HPI     Medical Management of Chronic Issues    Additional comments: Patient is present for chronic f/u, doing well      Last edited by Cherry Chiquita HERO, CMA on 09/03/2024  8:28 AM.       Discussed the use of AI scribe software for clinical note transcription with the patient, who gave verbal consent to proceed.  History of Present Illness Cassandra Parks is a 78 year old female with chronic kidney disease and diabetes who presents for routine follow-up.  She has chronic kidney disease and is currently on the ESRD transplant list with [facility] Her creatinine level was last recorded at 2.21 with an EGFR of 22, but recent labs show improvement with a creatinine of 1.83 and EGFR of 28, indicating stage 4 CKD. She was evaluated by a nephrologist on July 30, 2024.  She has diabetes with a last A1c of 7.8, aiming to keep it under 8. She uses Lantus  10 units at bedtime, only if her blood sugar is over 120. She used Lantus  twice in the last week, including after consuming cake and a soft drink, which raised her blood sugar to 149. She also takes Farxiga 10 mg daily.  She has chronic hypertension, managed with Cozaar 50 mg daily and Maxzide (triamterene  and hydrochlorothiazide) half a tablet daily.  She has chronic hyperlipidemia, with a last LDL of 135 and total cholesterol of 212. Recent labs show improvement with LDL down to 86 and total cholesterol to 160. She takes Crestor  5 mg daily.  She has a history of chronic vitamin D  deficiency secondary to chronic kidney disease and takes cholecalciferol 2000 units daily.  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     Past Medical History:  Diagnosis Date   Arthritis    hands   Diabetes mellitus without complication (HCC)    Hx of adenomatous colonic polyps    Hypertension    Palsy, Bell's     Medications: Outpatient Medications Prior to Visit  Medication Sig   insulin  glargine (LANTUS  SOLOSTAR) 100 UNIT/ML Solostar Pen Only take if glucose is greater than 120   Insulin  Pen Needle 32G X 4 MM MISC 1 Needle by Does not apply route at bedtime.   losartan (COZAAR) 50 MG tablet Take 50 mg by mouth daily.   rosuvastatin  (CRESTOR ) 5 MG tablet Take 1 tablet (5 mg total) by mouth daily.   [DISCONTINUED] FARXIGA 10 MG TABS tablet Take 10 mg by mouth every morning.   [DISCONTINUED] triamterene -hydrochlorothiazide (MAXZIDE-25) 37.5-25 MG tablet Take 1 tablet by mouth daily.   [DISCONTINUED] Cholecalciferol (VITAMIN D3) 1000 units CAPS Take 1 tablet by mouth daily.   [DISCONTINUED] Red Yeast Rice Extract (RED YEAST RICE PO) Take by mouth. (Patient not taking: Reported on 07/31/2024)   [DISCONTINUED] Vitamin D , Ergocalciferol , (DRISDOL) 1.25 MG (50000 UNIT) CAPS capsule Take 50,000 Units by mouth once a week. (Patient not taking: Reported on 07/31/2024)   No facility-administered medications prior to visit.    Review of Systems  Last metabolic panel Lab Results  Component Value Date   GLUCOSE 124 (H) 05/28/2024   NA 145 (H) 05/28/2024   K 4.2 05/28/2024   CL 106 05/28/2024   CO2 20 05/28/2024   BUN 37 (H) 05/28/2024   CREATININE 2.21 (H) 05/28/2024   EGFR 22 (L) 05/28/2024   CALCIUM  9.3 05/28/2024   PHOS 3.5 05/10/2021   PROT 6.8 05/28/2024   ALBUMIN 3.9 05/28/2024   LABGLOB 2.9 05/28/2024   BILITOT 0.4 05/28/2024   ALKPHOS 70 05/28/2024   AST 17 05/28/2024   ALT 9 05/28/2024   ANIONGAP 5 05/10/2021   Last lipids Lab Results  Component Value Date   CHOL 212 (H) 05/28/2024   HDL 60 05/28/2024   LDLCALC 135 (H) 05/28/2024   TRIG 97  05/28/2024   CHOLHDL 3.5 05/28/2024  The 10-year ASCVD risk score (Arnett DK, et al., 2019) is: 36%  Last hemoglobin A1c Lab Results  Component Value Date   HGBA1C 7.8 (H) 05/28/2024   Last thyroid functions No results found for: TSH, T3TOTAL, T4TOTAL, FREET4, THYROIDAB       Objective    BP 132/80 (Cuff Size: Normal)   Pulse 72   Ht 5' 1 (1.549 m)   Wt 127 lb 11.2 oz (57.9 kg)   SpO2 100%   BMI 24.13 kg/m   BP Readings from Last 3 Encounters:  09/03/24 132/80  06/06/24 120/76  05/27/24 125/74   Wt Readings from Last 3 Encounters:  09/03/24 127 lb 11.2 oz (57.9 kg)  07/31/24 126 lb (57.2 kg)  06/06/24 126 lb (57.2 kg)        Physical Exam Vitals reviewed.  Constitutional:      General: She is not in acute distress.    Appearance: Normal appearance. She is not ill-appearing.  Cardiovascular:     Rate and Rhythm: Normal rate and regular rhythm.  Pulmonary:     Effort: Pulmonary effort is normal. No respiratory distress.     Breath sounds: No wheezing, rhonchi or rales.  Neurological:     Mental Status: She is alert and oriented to person, place, and time.  Psychiatric:        Mood and Affect: Mood normal.        Behavior: Behavior normal.       No results found for any visits on 09/03/24.  Assessment & Plan     Problem List Items Addressed This Visit     Hyperlipidemia associated with type 2 diabetes mellitus (HCC)   Chronic condition with recent improvement in lipid profile. LDL decreased from 135 to 86 mg/dL with current Crestor  5 mg daily. Goal is to further reduce LDL to 55 mg/dL or lower. Discussed potential increase in Crestor  dosage in the future if weight remains stable. - Continue Crestor  5 mg daily. - Will consider increasing Crestor  to 10 mg in January if weight is stable and lipid goals are not met.      Relevant Medications   FARXIGA 10 MG TABS tablet   triamterene -hydrochlorothiazide (MAXZIDE-25) 37.5-25 MG tablet    Dulaglutide (TRULICITY) 0.75 MG/0.5ML SOAJ   Hypertension associated with diabetes (HCC)   chronic Blood pressure is mildly elevated at 143/80 mmHg. Current management includes losartan and triamterene /hydrochlorothiazide. Blood pressure goal is to maintain under 150/90 mmHg to avoid dizziness and lightheadedness. - Continue losartan 50 mg daily. - Continue triamterene /hydrochlorothiazide half tablet daily. - Monitor blood pressure regularly.      Relevant Medications   FARXIGA 10 MG TABS tablet   triamterene -hydrochlorothiazide (MAXZIDE-25) 37.5-25  MG tablet   Dulaglutide (TRULICITY) 0.75 MG/0.5ML SOAJ   Mixed conductive and sensorineural hearing loss of both ears   Palsy, Bell's   Type 2 diabetes mellitus with stage 4 chronic kidney disease, with long-term current use of insulin  (HCC) - Primary   Type 2 diabetes mellitus with stage 4 chronic kidney disease Chronic condition with recent A1c of 7.8%. Blood sugar management is ongoing with Farxiga and Lantus . Recent improvement in kidney function with creatinine at 1.83 and GFR at 28, indicating stage 4 CKD. Discussed potential addition of Trulicity to improve glycemic control without significant weight loss. She is hesitant about weight loss but agrees to try Trulicity. - Continue Farxiga 10 mg daily. - Continue Lantus  10 units at bedtime as needed for blood sugar >120 mg/dL. - Initiated Trulicity 0.75 mg weekly to improve glycemic control. - Will recheck A1c in January 2026 - Monitor weight to ensure stability.      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 - Continue cholecalciferol 2000 units daily.      Other Visit Diagnoses       Screening  for osteoporosis       Relevant Orders   DG Bone Density       Assessment and Plan Assessment & Plan     General Health Maintenance Routine health maintenance is due. Discussed need for bone density scan and mammogram. Recommended Shingrix vaccine and tetanus booster. - Ordered bone density scan. - Scheduled mammogram. - Recommended Shingrix vaccine and tetanus booster at pharmacy.     Return in about 2 months (around 11/04/2024) for DM(f/u trulcity change).         Rockie Agent, MD  [facility] [phone] (phone) [phone] (fax)  [facility]

## 2024-09-03 NOTE — Assessment & Plan Note (Signed)
 chronic Blood pressure is mildly elevated at 143/80 mmHg. Current management includes losartan and triamterene /hydrochlorothiazide. Blood pressure goal is to maintain under 150/90 mmHg to avoid dizziness and lightheadedness. - Continue losartan 50 mg daily. - Continue triamterene /hydrochlorothiazide half tablet daily. - Monitor blood pressure regularly.

## 2024-09-03 NOTE — Patient Instructions (Addendum)
 Please contact [facility] to schedule your Mammogram and Bone Density   [phone]   [address] [place], [place] [address]  To keep you healthy, please keep in mind the following health maintenance items that you are due for:   Health Maintenance Due  Topic Date Due   DTaP/Tdap/Td (1 - Tdap) Never done   Zoster Vaccines- Shingrix (1 of 2) Never done   Bone Density Scan  04/30/2018   Mammogram  08/20/2024     Best Wishes,   Dr. Lang

## 2024-09-03 NOTE — Assessment & Plan Note (Signed)
 Chronic condition with recent improvement in lipid profile. LDL decreased from 135 to 86 mg/dL with current Crestor  5 mg daily. Goal is to further reduce LDL to 55 mg/dL or lower. Discussed potential increase in Crestor  dosage in the future if weight remains stable. - Continue Crestor  5 mg daily. - Will consider increasing Crestor  to 10 mg in January if weight is stable and lipid goals are not met.

## 2024-09-03 NOTE — Assessment & Plan Note (Signed)
 Type 2 diabetes mellitus with stage 4 chronic kidney disease Chronic condition with recent A1c of 7.8%. Blood sugar management is ongoing with Farxiga  and Lantus . Recent improvement in kidney function with creatinine at 1.83 and GFR at 28, indicating stage 4 CKD. Discussed potential addition of Trulicity  to improve glycemic control without significant weight loss. She is hesitant about weight loss but agrees to try Trulicity . - Continue Farxiga  10 mg daily. - Continue Lantus  10 units at bedtime as needed for blood sugar >120 mg/dL. - Initiated Trulicity  0.75 mg weekly to improve glycemic control. - Will recheck A1c in January 2026 - Monitor weight to ensure stability.

## 2024-09-10 ENCOUNTER — Encounter: Admitting: Family Medicine

## 2024-09-11 ENCOUNTER — Encounter: Attending: Family Medicine | Admitting: Dietician

## 2024-09-11 DIAGNOSIS — N2581 Secondary hyperparathyroidism of renal origin: Secondary | ICD-10-CM | POA: Diagnosis not present

## 2024-09-11 DIAGNOSIS — E1122 Type 2 diabetes mellitus with diabetic chronic kidney disease: Secondary | ICD-10-CM | POA: Diagnosis present

## 2024-09-11 DIAGNOSIS — Z794 Long term (current) use of insulin: Secondary | ICD-10-CM | POA: Insufficient documentation

## 2024-09-11 DIAGNOSIS — N184 Chronic kidney disease, stage 4 (severe): Secondary | ICD-10-CM | POA: Insufficient documentation

## 2024-09-11 DIAGNOSIS — Z713 Dietary counseling and surveillance: Secondary | ICD-10-CM | POA: Diagnosis not present

## 2024-09-11 DIAGNOSIS — I129 Hypertensive chronic kidney disease with stage 1 through stage 4 chronic kidney disease, or unspecified chronic kidney disease: Secondary | ICD-10-CM | POA: Diagnosis not present

## 2024-09-11 DIAGNOSIS — Z7985 Long-term (current) use of injectable non-insulin antidiabetic drugs: Secondary | ICD-10-CM | POA: Insufficient documentation

## 2024-09-11 DIAGNOSIS — Z7984 Long term (current) use of oral hypoglycemic drugs: Secondary | ICD-10-CM | POA: Diagnosis not present

## 2024-09-11 NOTE — Patient Instructions (Addendum)
 Increase your water intake daily. Your goal is to consume 48 oz a day, or 3 water bottles.  Begin to replace your regular Coke with Coke ZERO, and make your Sweet tea using Splenda or Stevia or Monk Fruit sweeteners.  Try having Baked Lay's chips instead of the original Lay's potato chips.  When having nuts, choose lightly salted or unsalted versions, ans try to keep your portion to a palm full.  Choose lower sodium options when selecting frozen meals! Try to keep your sodium between 300 - 500 mg for a meal!

## 2024-09-11 NOTE — Progress Notes (Signed)
 Diabetes Self-Management Education  Visit Type: First/Initial  Appt. Start Time: 1405 Appt. End Time: 1525  09/11/2024  [name], identified by name and date of birth, is a 78 y.o. female with a diagnosis of Diabetes: Type 2.   ASSESSMENT Pt reports desire to improve blood glucose though dietary changes. Pt reports currently taking Farxiga  @10  mg, Lantus  @10u  PRN is glucose is >120 mg/dL, will be starting Trulicity  @0 .75 mg this week. Pt reports usually not having first meal for ~3 hours after they wake up, typically eats a brunch and dinner meal daily. Pt reports eating away from home/packaged foods most meals. Pt reports good appetite currently, history of weight loss while grieving over multiple deaths in the family 4 years ago, states they are comfortable at current body weight and has remained stable there. Pt reports staying busy during the day doing errands, work for [employer], chores at home.   Diabetes Self-Management Education - 09/11/24 1413       Visit Information   Visit Type First/Initial      Initial Visit   Diabetes Type Type 2    Date Diagnosed Over 10 years ag    Are you currently following a meal plan? No    Are you taking your medications as prescribed? Yes      Health Coping   How would you rate your overall health? Good      Psychosocial Assessment   Patient Belief/Attitude about Diabetes Motivated to manage diabetes    What is the hardest part about your diabetes right now, causing you the most concern, or is the most worrisome to you about your diabetes?   Making healty food and beverage choices    Self-care barriers None    Self-management support Doctor's office    Other persons present Patient    Patient Concerns Nutrition/Meal planning    Special Needs None    Preferred Learning Style Visual;Auditory    Learning Readiness Ready    How often do you need to have someone help you when you read instructions, pamphlets, or other written  materials from your doctor or pharmacy? 1 - Never    What is the last grade level you completed in school? [employer]      Pre-Education Assessment   Patient understands the diabetes disease and treatment process. Needs Instruction    Patient understands incorporating nutritional management into lifestyle. Needs Instruction    Patient undertands incorporating physical activity into lifestyle. Needs Instruction    Patient understands using medications safely. Needs Instruction    Patient understands monitoring blood glucose, interpreting and using results Needs Instruction    Patient understands prevention, detection, and treatment of acute complications. Needs Instruction    Patient understands prevention, detection, and treatment of chronic complications. Needs Instruction    Patient understands how to develop strategies to address psychosocial issues. Needs Instruction    Patient understands how to develop strategies to promote health/change behavior. Needs Instruction      Complications   Last HgB A1C per patient/outside source 7.8 %   07/23/2024   How often do you check your blood sugar? 1-2 times/day    Fasting Blood glucose range (mg/dL) 70-129   110-115 mg/dL usually   Postprandial Blood glucose range (mg/dL) 869-820   Usually ~849 mg/dL   Have you had a dilated eye exam in the past 12 months? Yes    Have you had a dental exam in the past 12 months? Yes  Are you checking your feet? Yes    How many days per week are you checking your feet? 7      Dietary Intake   Breakfast 1 Sauage patty, 1 egg scrambled, glass of whole milk    Snack (afternoon) Potato chips (Lays)    Dinner 2 cheeseburgers w/ lettuce, tomato, 16 oz Coke    Beverage(s) Whole milk, Coke, Water      Activity / Exercise   Activity / Exercise Type ADL's    How many days per week do you exercise? 0    How many minutes per day do you exercise? 0    Total minutes per week of exercise 0      Patient  Education   Previous Diabetes Education No    Disease Pathophysiology Explored patient's options for treatment of their diabetes    Healthy Eating Role of diet in the treatment of diabetes and the relationship between the three main macronutrients and blood glucose level;Plate Method;Meal options for control of blood glucose level and chronic complications.;Information on hints to eating out and maintain blood glucose control.    Being Active Role of exercise on diabetes management, blood pressure control and cardiac health.    Medications Reviewed patients medication for diabetes, action, purpose, timing of dose and side effects.    Monitoring Identified appropriate SMBG and/or A1C goals.    Chronic complications Relationship between chronic complications and blood glucose control;Nephropathy, what it is, prevention of, the use of ACE, ARB's and early detection of through urine microalbumia.    Diabetes Stress and Support Identified and addressed patients feelings and concerns about diabetes      Individualized Goals (developed by patient)   Nutrition Follow meal plan discussed;General guidelines for healthy choices and portions discussed    Medications take my medication as prescribed    Monitoring  Test my blood glucose as discussed    Reducing Risk examine blood glucose patterns      Post-Education Assessment   Patient understands the diabetes disease and treatment process. Needs Review    Patient understands incorporating nutritional management into lifestyle. Needs Review    Patient undertands incorporating physical activity into lifestyle. Comprehends key points    Patient understands using medications safely. Comphrehends key points    Patient understands monitoring blood glucose, interpreting and using results Comprehends key points    Patient understands prevention, detection, and treatment of acute complications. Comprehends key points    Patient understands prevention, detection,  and treatment of chronic complications. Needs Review   CKD   Patient understands how to develop strategies to address psychosocial issues. Needs Review    Patient understands how to develop strategies to promote health/change behavior. Needs Review      Outcomes   Expected Outcomes Demonstrated interest in learning. Expect positive outcomes    Future DMSE 6 months    Program Status Not Completed          Individualized Plan for Diabetes Self-Management Training:   Learning Objective:  Patient will have a greater understanding of diabetes self-management. Patient education plan is to attend individual and/or group sessions per assessed needs and concerns.   Plan:   Patient Instructions  Increase your water intake daily. Your goal is to consume 48 oz a day, or 3 water bottles.  Begin to replace your regular Coke with Coke ZERO, and make your Sweet tea using Splenda or Stevia or Monk Fruit sweeteners.  Try having Baked Lay's chips instead of the original Lay's  potato chips.  When having nuts, choose lightly salted or unsalted versions, ans try to keep your portion to a palm full.  Choose lower sodium options when selecting frozen meals! Try to keep your sodium between 300 - 500 mg for a meal!  Expected Outcomes:  Demonstrated interest in learning. Expect positive outcomes  Education material provided: My Plate, Carbohydrate Counting for People With Diabetes  If problems or questions, patient to contact team via:  Phone and Email  Future DSME appointment: 6 months

## 2024-09-13 ENCOUNTER — Ambulatory Visit: Admitting: Podiatry

## 2024-09-13 ENCOUNTER — Encounter: Payer: Self-pay | Admitting: Podiatry

## 2024-09-13 DIAGNOSIS — E1165 Type 2 diabetes mellitus with hyperglycemia: Secondary | ICD-10-CM | POA: Diagnosis not present

## 2024-09-13 DIAGNOSIS — Z794 Long term (current) use of insulin: Secondary | ICD-10-CM

## 2024-09-13 DIAGNOSIS — L84 Corns and callosities: Secondary | ICD-10-CM | POA: Diagnosis not present

## 2024-09-13 DIAGNOSIS — M79674 Pain in right toe(s): Secondary | ICD-10-CM

## 2024-09-13 DIAGNOSIS — M79675 Pain in left toe(s): Secondary | ICD-10-CM | POA: Diagnosis not present

## 2024-09-13 DIAGNOSIS — B351 Tinea unguium: Secondary | ICD-10-CM

## 2024-09-21 NOTE — Progress Notes (Signed)
"  °  Subjective:  Patient ID: Cassandra Parks, female    DOB: August 12, 1946,  MRN: [id number]  Cassandra Parks presents to clinic today for preventative diabetic foot care and corn(s) left foot and painful mycotic toenails that are difficult to trim. Painful toenails interfere with ambulation. Aggravating factors include wearing enclosed shoe gear. Pain is relieved with periodic professional debridement. Painful corns are aggravated when weightbearing when wearing enclosed shoe gear. Pain is relieved with periodic professional debridement.  Chief Complaint  Patient presents with   Diabetes    A1c 7.8.  She saw Dr. Hoy on 09/03/24   New problem(s): None.   PCP is Simmons-Robinson, Makiera, MD.  Allergies[1]  Review of Systems: Negative except as noted in the HPI.  Objective: No changes noted in today's physical examination. There were no vitals filed for this visit. CHEYENE Parks is a pleasant 78 y.o. female WD, WN in NAD. AAO x 3.  Intact posterior tibialis and dorsalis pedis pulses Vascular Examination: Capillary refill time immediate b/l. Palpable pedal pulses. Pedal hair present b/l. Pedal edema absent. No pain with calf compression b/l. Skin temperature gradient WNL b/l. No cyanosis or clubbing b/l. No ischemia or gangrene noted b/l.   Neurological Examination: Sensation grossly intact b/l with 10 gram monofilament. Vibratory sensation intact b/l.   Dermatological Examination: Pedal skin with normal turgor, texture and tone b/l.  No open wounds. No interdigital macerations.   Toenails 1-5 b/l thick, discolored, elongated with subungual debris and pain on dorsal palpation.   Incurvated nailplate both borders of left hallux and both borders of right hallux with tenderness to palpation. No erythema, no edema, no drainage noted. No fluctuance.   Hyperkeratotic lesion(s) dorsal PIPJ of L 5th toe.  No erythema, no edema, no drainage, no fluctuance.  Musculoskeletal  Examination: Muscle strength 5/5 to all lower extremity muscle groups bilaterally. No pain, crepitus or joint limitation noted with ROM bilateral LE. Hammertoe(s) b/l 5th toes.  Radiographs: None  Assessment/Plan: 1. Pain due to onychomycosis of toenails of both feet   2. Corns   3. Type 2 diabetes mellitus with hyperglycemia, with long-term current use of insulin  (HCC)    -Consent given for treatment as described below: -Examined patient. -Continue foot and shoe inspections daily. Monitor blood glucose per PCP/Endocrinologist's recommendations. -Patient to continue soft, supportive shoe gear daily. -Toenails 1-5 b/l were debrided in length and girth with sterile nail nippers and dremel without iatrogenic bleeding.  -Corn(s) dorsal PIPJ left 5th toe pared utilizing sterile scalpel blade without complication or incident. Total number debrided=1. -Patient/POA to call should there be question/concern in the interim.   Return in about 3 months (around 12/12/2024).  Delon LITTIE Merlin, DPM       LOCATION: [address] [place] [address]                   Office [phone]   [facility] LOCATION: [address] [place], [place] [address] Office [phone]     [1]  Allergies Allergen Reactions   Penicillins Swelling   Shellfish Allergy    Sulfa Antibiotics Cough   "

## 2024-10-29 ENCOUNTER — Other Ambulatory Visit: Payer: Self-pay | Admitting: Family Medicine

## 2024-10-29 DIAGNOSIS — E1122 Type 2 diabetes mellitus with diabetic chronic kidney disease: Secondary | ICD-10-CM

## 2024-11-05 ENCOUNTER — Encounter: Payer: Self-pay | Admitting: Family Medicine

## 2024-11-05 ENCOUNTER — Ambulatory Visit: Admitting: Family Medicine

## 2024-11-05 VITALS — BP 127/79 | HR 87 | Temp 98.4°F | Ht 61.0 in | Wt 121.5 lb

## 2024-11-05 DIAGNOSIS — Z794 Long term (current) use of insulin: Secondary | ICD-10-CM | POA: Diagnosis not present

## 2024-11-05 DIAGNOSIS — E1159 Type 2 diabetes mellitus with other circulatory complications: Secondary | ICD-10-CM | POA: Diagnosis not present

## 2024-11-05 DIAGNOSIS — E559 Vitamin D deficiency, unspecified: Secondary | ICD-10-CM

## 2024-11-05 DIAGNOSIS — G510 Bell's palsy: Secondary | ICD-10-CM

## 2024-11-05 DIAGNOSIS — N184 Chronic kidney disease, stage 4 (severe): Secondary | ICD-10-CM | POA: Diagnosis not present

## 2024-11-05 DIAGNOSIS — E1169 Type 2 diabetes mellitus with other specified complication: Secondary | ICD-10-CM

## 2024-11-05 DIAGNOSIS — G8929 Other chronic pain: Secondary | ICD-10-CM | POA: Insufficient documentation

## 2024-11-05 DIAGNOSIS — E785 Hyperlipidemia, unspecified: Secondary | ICD-10-CM | POA: Diagnosis not present

## 2024-11-05 DIAGNOSIS — N2581 Secondary hyperparathyroidism of renal origin: Secondary | ICD-10-CM | POA: Diagnosis not present

## 2024-11-05 DIAGNOSIS — E1122 Type 2 diabetes mellitus with diabetic chronic kidney disease: Secondary | ICD-10-CM | POA: Diagnosis not present

## 2024-11-05 DIAGNOSIS — M25561 Pain in right knee: Secondary | ICD-10-CM | POA: Diagnosis not present

## 2024-11-05 DIAGNOSIS — H906 Mixed conductive and sensorineural hearing loss, bilateral: Secondary | ICD-10-CM

## 2024-11-05 DIAGNOSIS — I152 Hypertension secondary to endocrine disorders: Secondary | ICD-10-CM | POA: Diagnosis not present

## 2024-11-05 LAB — POCT GLYCOSYLATED HEMOGLOBIN (HGB A1C): Hemoglobin A1C: 7.3 % — AB (ref 4.0–5.6)

## 2024-11-05 MED ORDER — PREDNISONE 20 MG PO TABS: ORAL_TABLET | ORAL | 0 refills | Status: AC

## 2024-11-05 MED ORDER — TRULICITY 0.75 MG/0.5ML ~~LOC~~ SOAJ: 0.7500 mg | SUBCUTANEOUS | Status: AC

## 2024-11-05 NOTE — Assessment & Plan Note (Signed)
 Chronic right knee pain Chronic right knee pain, possibly due to arthritis. Symptoms include soreness and swelling, relieved by Tylenol . No recent imaging. - Recommended Voltaren gel, apply four times daily. - Prescribed prednisone  20 mg for 3 days, then 10 mg for 2 days if symptoms do not improve with gel.

## 2024-11-05 NOTE — Assessment & Plan Note (Signed)
" °  Chronic hyperlipidemia associated with type 2 diabetes mellitus  - Continue rosuvastatin  (Crestor ) 5 mg daily. "

## 2024-11-05 NOTE — Assessment & Plan Note (Signed)
-   Chronic Stable

## 2024-11-05 NOTE — Assessment & Plan Note (Addendum)
 Chronic  Type 2 diabetes mellitus associated with stage 4 chronic kidney disease.  Recent A1c improved to 7.3% from 7.8%. Weight loss of 6 pounds since December, possibly due to Trulicity . Goal is to maintain A1c below 7.5%. - Changed Trulicity  to 0.75 mg every other week. - Continue Lantus  10 units if fasting glucose is greater than 120 mg/dL. If weight drops below 115lbs, she has been advised to stop Trulicity  and let me know and we will adjust as appropriate

## 2024-11-05 NOTE — Assessment & Plan Note (Signed)
 Chronic hypertension associated with diabetes Chronic hypertension associated with diabetes. Blood pressure well-controlled at 127/79 mmHg. - Continue losartan 50 mg daily. - Continue triamterene  37.5 mg daily, cut in half due to CKD. - Continue hydrochlorothiazide 25 mg daily, cut in half due to CKD.

## 2024-11-06 ENCOUNTER — Ambulatory Visit
Admission: RE | Admit: 2024-11-06 | Discharge: 2024-11-06 | Disposition: A | Source: Ambulatory Visit | Attending: Family Medicine

## 2024-11-06 DIAGNOSIS — Z1231 Encounter for screening mammogram for malignant neoplasm of breast: Secondary | ICD-10-CM

## 2024-11-06 DIAGNOSIS — Z13820 Encounter for screening for osteoporosis: Secondary | ICD-10-CM

## 2024-12-04 ENCOUNTER — Encounter: Admitting: Dietician

## 2024-12-16 ENCOUNTER — Ambulatory Visit: Admitting: Podiatry

## 2025-02-10 ENCOUNTER — Ambulatory Visit: Admitting: Family Medicine

## 2025-08-06 ENCOUNTER — Ambulatory Visit
